# Patient Record
Sex: Female | Born: 1965
Health system: Southern US, Community
[De-identification: ages and names within clinical notes are randomized; demographics above are authoritative.]

## PROBLEM LIST (undated history)

## (undated) ENCOUNTER — Inpatient Hospital Stay (HOSPITAL_COMMUNITY): Payer: BLUE CROSS/BLUE SHIELD

## (undated) DIAGNOSIS — M84375A Stress fracture, left foot, initial encounter for fracture: Secondary | ICD-10-CM

## (undated) DIAGNOSIS — I4719 Other supraventricular tachycardia: Secondary | ICD-10-CM

## (undated) DIAGNOSIS — IMO0001 Reserved for inherently not codable concepts without codable children: Secondary | ICD-10-CM

## (undated) DIAGNOSIS — J069 Acute upper respiratory infection, unspecified: Secondary | ICD-10-CM

## (undated) DIAGNOSIS — D802 Selective deficiency of immunoglobulin A [IgA]: Secondary | ICD-10-CM

## (undated) DIAGNOSIS — G43909 Migraine, unspecified, not intractable, without status migrainosus: Secondary | ICD-10-CM

## (undated) DIAGNOSIS — M3500 Sicca syndrome, unspecified: Secondary | ICD-10-CM

## (undated) DIAGNOSIS — I471 Supraventricular tachycardia: Secondary | ICD-10-CM

## (undated) DIAGNOSIS — M069 Rheumatoid arthritis, unspecified: Secondary | ICD-10-CM

## (undated) DIAGNOSIS — M797 Fibromyalgia: Secondary | ICD-10-CM

## (undated) DIAGNOSIS — K219 Gastro-esophageal reflux disease without esophagitis: Secondary | ICD-10-CM

## (undated) DIAGNOSIS — U071 COVID-19: Secondary | ICD-10-CM

## (undated) HISTORY — PX: ROTATOR CUFF REPAIR: SHX139

## (undated) HISTORY — DX: Acute upper respiratory infection, unspecified: J06.9

## (undated) HISTORY — PX: SINOSCOPY: SHX187

## (undated) HISTORY — DX: Rheumatoid arthritis, unspecified: M06.9

## (undated) HISTORY — PX: TONSILLECTOMY: SUR1361

## (undated) HISTORY — PX: BREAST ENHANCEMENT SURGERY: SHX7

## (undated) HISTORY — PX: KNEE ARTHROSCOPY: SUR90

## (undated) HISTORY — DX: COVID-19: U07.1

## (undated) HISTORY — DX: Supraventricular tachycardia: I47.1

## (undated) HISTORY — PX: TOE SURGERY: SHX1073

## (undated) HISTORY — DX: Gastro-esophageal reflux disease without esophagitis: K21.9

## (undated) HISTORY — DX: Sjogren syndrome, unspecified: M35.00

## (undated) HISTORY — DX: Reserved for inherently not codable concepts without codable children: IMO0001

## (undated) HISTORY — DX: Stress fracture, left foot, initial encounter for fracture: M84.375A

## (undated) HISTORY — PX: HIP ARTHROSCOPY: SUR88

## (undated) HISTORY — DX: Migraine, unspecified, not intractable, without status migrainosus: G43.909

## (undated) HISTORY — DX: Other supraventricular tachycardia: I47.19

## (undated) HISTORY — PX: NASAL SINUS SURGERY: SHX719

## (undated) HISTORY — DX: Selective deficiency of immunoglobulin a (iga): D80.2

## (undated) HISTORY — PX: LASIK: SHX215

## (undated) HISTORY — PX: POLYPECTOMY: SHX149

---

## 1998-06-05 ENCOUNTER — Other Ambulatory Visit: Admission: RE | Admit: 1998-06-05 | Discharge: 1998-06-05 | Payer: Self-pay | Admitting: *Deleted

## 1999-04-24 ENCOUNTER — Inpatient Hospital Stay (HOSPITAL_COMMUNITY): Admission: AD | Admit: 1999-04-24 | Discharge: 1999-04-27 | Payer: Self-pay | Admitting: Obstetrics and Gynecology

## 1999-04-24 ENCOUNTER — Encounter (INDEPENDENT_AMBULATORY_CARE_PROVIDER_SITE_OTHER): Payer: Self-pay | Admitting: Specialist

## 1999-04-28 ENCOUNTER — Encounter: Admission: RE | Admit: 1999-04-28 | Discharge: 1999-07-27 | Payer: Self-pay | Admitting: Obstetrics and Gynecology

## 1999-05-21 ENCOUNTER — Other Ambulatory Visit: Admission: RE | Admit: 1999-05-21 | Discharge: 1999-05-21 | Payer: Self-pay | Admitting: Obstetrics and Gynecology

## 1999-08-04 ENCOUNTER — Emergency Department (HOSPITAL_COMMUNITY): Admission: EM | Admit: 1999-08-04 | Discharge: 1999-08-04 | Payer: Self-pay | Admitting: Internal Medicine

## 2000-07-10 ENCOUNTER — Other Ambulatory Visit: Admission: RE | Admit: 2000-07-10 | Discharge: 2000-07-10 | Payer: Self-pay | Admitting: Obstetrics and Gynecology

## 2001-08-08 ENCOUNTER — Other Ambulatory Visit: Admission: RE | Admit: 2001-08-08 | Discharge: 2001-08-08 | Payer: Self-pay | Admitting: Obstetrics and Gynecology

## 2002-08-16 ENCOUNTER — Encounter: Payer: Self-pay | Admitting: Gastroenterology

## 2002-10-15 ENCOUNTER — Other Ambulatory Visit: Admission: RE | Admit: 2002-10-15 | Discharge: 2002-10-15 | Payer: Self-pay | Admitting: Obstetrics and Gynecology

## 2002-12-05 ENCOUNTER — Ambulatory Visit (HOSPITAL_COMMUNITY): Admission: RE | Admit: 2002-12-05 | Discharge: 2002-12-05 | Payer: Self-pay | Admitting: Obstetrics and Gynecology

## 2003-02-27 ENCOUNTER — Ambulatory Visit (HOSPITAL_COMMUNITY): Admission: RE | Admit: 2003-02-27 | Discharge: 2003-02-27 | Payer: Self-pay | Admitting: Obstetrics and Gynecology

## 2003-07-07 ENCOUNTER — Encounter: Payer: Self-pay | Admitting: Internal Medicine

## 2003-08-31 ENCOUNTER — Ambulatory Visit (HOSPITAL_COMMUNITY): Admission: RE | Admit: 2003-08-31 | Discharge: 2003-08-31 | Payer: Self-pay | Admitting: Family Medicine

## 2003-10-09 ENCOUNTER — Ambulatory Visit (HOSPITAL_COMMUNITY): Admission: RE | Admit: 2003-10-09 | Discharge: 2003-10-09 | Payer: Self-pay | Admitting: Family Medicine

## 2003-12-05 ENCOUNTER — Encounter: Admission: RE | Admit: 2003-12-05 | Discharge: 2003-12-05 | Payer: Self-pay | Admitting: Family Medicine

## 2004-01-20 ENCOUNTER — Ambulatory Visit (HOSPITAL_COMMUNITY): Admission: RE | Admit: 2004-01-20 | Discharge: 2004-01-20 | Payer: Self-pay | Admitting: Orthopedic Surgery

## 2004-06-01 ENCOUNTER — Ambulatory Visit (HOSPITAL_COMMUNITY): Admission: RE | Admit: 2004-06-01 | Discharge: 2004-06-01 | Payer: Self-pay | Admitting: Orthopedic Surgery

## 2004-06-25 ENCOUNTER — Ambulatory Visit: Payer: Self-pay | Admitting: Internal Medicine

## 2004-07-15 ENCOUNTER — Ambulatory Visit: Payer: Self-pay | Admitting: Internal Medicine

## 2004-07-28 ENCOUNTER — Ambulatory Visit: Payer: Self-pay | Admitting: Internal Medicine

## 2004-08-23 ENCOUNTER — Ambulatory Visit: Payer: Self-pay | Admitting: Gastroenterology

## 2004-08-24 ENCOUNTER — Ambulatory Visit: Payer: Self-pay | Admitting: Gastroenterology

## 2004-08-31 ENCOUNTER — Ambulatory Visit: Payer: Self-pay | Admitting: Gastroenterology

## 2004-09-16 ENCOUNTER — Ambulatory Visit: Payer: Self-pay | Admitting: Gastroenterology

## 2004-09-16 ENCOUNTER — Ambulatory Visit (HOSPITAL_COMMUNITY): Admission: RE | Admit: 2004-09-16 | Discharge: 2004-09-16 | Payer: Self-pay | Admitting: Gastroenterology

## 2004-10-19 ENCOUNTER — Ambulatory Visit: Payer: Self-pay | Admitting: Gastroenterology

## 2004-11-05 ENCOUNTER — Ambulatory Visit: Payer: Self-pay | Admitting: Gastroenterology

## 2005-02-03 ENCOUNTER — Ambulatory Visit: Payer: Self-pay | Admitting: Gastroenterology

## 2005-03-02 ENCOUNTER — Ambulatory Visit: Payer: Self-pay | Admitting: Gastroenterology

## 2005-03-02 ENCOUNTER — Encounter: Payer: Self-pay | Admitting: Gastroenterology

## 2005-03-02 ENCOUNTER — Ambulatory Visit (HOSPITAL_COMMUNITY): Admission: RE | Admit: 2005-03-02 | Discharge: 2005-03-02 | Payer: Self-pay | Admitting: Gastroenterology

## 2005-03-02 ENCOUNTER — Encounter (INDEPENDENT_AMBULATORY_CARE_PROVIDER_SITE_OTHER): Payer: Self-pay | Admitting: Specialist

## 2005-03-10 ENCOUNTER — Encounter: Admission: RE | Admit: 2005-03-10 | Discharge: 2005-03-10 | Payer: Self-pay | Admitting: Family Medicine

## 2005-03-16 ENCOUNTER — Ambulatory Visit: Payer: Self-pay | Admitting: Gastroenterology

## 2005-04-26 ENCOUNTER — Ambulatory Visit: Payer: Self-pay | Admitting: Gastroenterology

## 2005-05-26 ENCOUNTER — Ambulatory Visit: Payer: Self-pay | Admitting: Family Medicine

## 2005-06-03 ENCOUNTER — Ambulatory Visit: Payer: Self-pay | Admitting: Internal Medicine

## 2005-06-06 ENCOUNTER — Ambulatory Visit: Payer: Self-pay | Admitting: Internal Medicine

## 2005-06-10 ENCOUNTER — Ambulatory Visit: Payer: Self-pay | Admitting: Internal Medicine

## 2005-06-20 ENCOUNTER — Ambulatory Visit: Payer: Self-pay | Admitting: Internal Medicine

## 2005-07-08 ENCOUNTER — Ambulatory Visit: Payer: Self-pay | Admitting: Internal Medicine

## 2005-08-15 ENCOUNTER — Ambulatory Visit: Payer: Self-pay | Admitting: Gastroenterology

## 2006-01-13 ENCOUNTER — Ambulatory Visit: Payer: Self-pay | Admitting: Gastroenterology

## 2006-04-14 ENCOUNTER — Ambulatory Visit: Payer: Self-pay | Admitting: Internal Medicine

## 2006-04-14 LAB — CONVERTED CEMR LAB
ALT: 14 units/L (ref 0–40)
AST: 18 units/L (ref 0–37)
Albumin: 4.1 g/dL (ref 3.5–5.2)
Alkaline Phosphatase: 36 units/L — ABNORMAL LOW (ref 39–117)
BUN: 10 mg/dL (ref 6–23)
Basophils Absolute: 0 10*3/uL (ref 0.0–0.1)
Basophils Relative: 0.3 % (ref 0.0–1.0)
CO2: 28 meq/L (ref 19–32)
Calcium: 9.4 mg/dL (ref 8.4–10.5)
Chloride: 108 meq/L (ref 96–112)
Creatinine, Ser: 1.1 mg/dL (ref 0.4–1.2)
Eosinophil percent: 1 % (ref 0.0–5.0)
GFR calc non Af Amer: 58 mL/min
Glomerular Filtration Rate, Af Am: 70 mL/min/{1.73_m2}
Glucose, Bld: 83 mg/dL (ref 70–99)
HCT: 40.7 % (ref 36.0–46.0)
Hemoglobin: 14 g/dL (ref 12.0–15.0)
Lymphocytes Relative: 32.3 % (ref 12.0–46.0)
MCHC: 34.4 g/dL (ref 30.0–36.0)
MCV: 90 fL (ref 78.0–100.0)
Monocytes Absolute: 0.5 10*3/uL (ref 0.2–0.7)
Monocytes Relative: 8.5 % (ref 3.0–11.0)
Neutro Abs: 3.5 10*3/uL (ref 1.4–7.7)
Neutrophils Relative %: 57.9 % (ref 43.0–77.0)
Platelets: 227 10*3/uL (ref 150–400)
Potassium: 4.3 meq/L (ref 3.5–5.1)
RBC: 4.52 M/uL (ref 3.87–5.11)
RDW: 11.9 % (ref 11.5–14.6)
Sodium: 141 meq/L (ref 135–145)
TSH: 1.32 microintl units/mL (ref 0.35–5.50)
Total Bilirubin: 0.7 mg/dL (ref 0.3–1.2)
Total Protein: 7.3 g/dL (ref 6.0–8.3)
WBC: 6.1 10*3/uL (ref 4.5–10.5)

## 2006-05-01 ENCOUNTER — Ambulatory Visit: Payer: Self-pay | Admitting: Family Medicine

## 2006-05-05 ENCOUNTER — Ambulatory Visit: Payer: Self-pay | Admitting: Internal Medicine

## 2006-05-05 LAB — CONVERTED CEMR LAB
Cholesterol: 205 mg/dL (ref 0–200)
Direct LDL: 134.3 mg/dL
HDL: 52.6 mg/dL (ref >39.0)
Total CHOL/HDL Ratio: 3.9
Triglycerides: 124 mg/dL (ref 0–149)
VLDL: 25 mg/dL (ref 0–40)

## 2006-05-11 DIAGNOSIS — J309 Allergic rhinitis, unspecified: Secondary | ICD-10-CM | POA: Insufficient documentation

## 2006-05-11 DIAGNOSIS — J45909 Unspecified asthma, uncomplicated: Secondary | ICD-10-CM | POA: Insufficient documentation

## 2006-05-24 ENCOUNTER — Emergency Department (HOSPITAL_COMMUNITY): Admission: EM | Admit: 2006-05-24 | Discharge: 2006-05-24 | Payer: Self-pay | Admitting: Emergency Medicine

## 2006-05-26 ENCOUNTER — Ambulatory Visit: Payer: Self-pay | Admitting: Internal Medicine

## 2006-07-07 ENCOUNTER — Ambulatory Visit: Payer: Self-pay | Admitting: Internal Medicine

## 2006-11-09 ENCOUNTER — Ambulatory Visit: Payer: Self-pay | Admitting: Gastroenterology

## 2006-11-20 ENCOUNTER — Ambulatory Visit: Payer: Self-pay | Admitting: Internal Medicine

## 2006-11-28 ENCOUNTER — Encounter (INDEPENDENT_AMBULATORY_CARE_PROVIDER_SITE_OTHER): Payer: Self-pay | Admitting: *Deleted

## 2006-11-28 LAB — CONVERTED CEMR LAB
Cholesterol: 191 mg/dL (ref 0–200)
HDL: 62.3 mg/dL (ref >39.0)
LDL Cholesterol: 114 mg/dL — ABNORMAL HIGH (ref 0–99)
Total CHOL/HDL Ratio: 3.1
Triglycerides: 73 mg/dL (ref 0–149)
VLDL: 15 mg/dL (ref 0–40)

## 2007-02-07 ENCOUNTER — Ambulatory Visit: Payer: Self-pay | Admitting: Internal Medicine

## 2007-04-02 ENCOUNTER — Ambulatory Visit: Payer: Self-pay | Admitting: Internal Medicine

## 2007-04-10 ENCOUNTER — Ambulatory Visit: Payer: Self-pay | Admitting: Internal Medicine

## 2007-04-20 ENCOUNTER — Telehealth: Payer: Self-pay | Admitting: Internal Medicine

## 2007-04-23 ENCOUNTER — Ambulatory Visit: Payer: Self-pay | Admitting: Cardiology

## 2007-04-24 ENCOUNTER — Telehealth (INDEPENDENT_AMBULATORY_CARE_PROVIDER_SITE_OTHER): Payer: Self-pay | Admitting: *Deleted

## 2007-04-24 ENCOUNTER — Ambulatory Visit: Payer: Self-pay | Admitting: Internal Medicine

## 2007-04-25 ENCOUNTER — Telehealth (INDEPENDENT_AMBULATORY_CARE_PROVIDER_SITE_OTHER): Payer: Self-pay | Admitting: *Deleted

## 2007-04-25 DIAGNOSIS — D802 Selective deficiency of immunoglobulin A [IgA]: Secondary | ICD-10-CM | POA: Insufficient documentation

## 2007-05-01 ENCOUNTER — Encounter: Payer: Self-pay | Admitting: Internal Medicine

## 2007-05-08 ENCOUNTER — Encounter: Payer: Self-pay | Admitting: Internal Medicine

## 2007-05-31 ENCOUNTER — Encounter: Payer: Self-pay | Admitting: Internal Medicine

## 2007-07-24 DIAGNOSIS — K219 Gastro-esophageal reflux disease without esophagitis: Secondary | ICD-10-CM | POA: Insufficient documentation

## 2007-07-24 DIAGNOSIS — K589 Irritable bowel syndrome without diarrhea: Secondary | ICD-10-CM | POA: Insufficient documentation

## 2007-09-17 ENCOUNTER — Encounter: Payer: Self-pay | Admitting: Internal Medicine

## 2007-09-26 ENCOUNTER — Encounter: Payer: Self-pay | Admitting: Internal Medicine

## 2007-11-05 ENCOUNTER — Encounter: Payer: Self-pay | Admitting: Internal Medicine

## 2008-01-01 ENCOUNTER — Ambulatory Visit: Payer: Self-pay | Admitting: Internal Medicine

## 2008-01-18 ENCOUNTER — Telehealth: Payer: Self-pay | Admitting: Gastroenterology

## 2008-01-21 ENCOUNTER — Ambulatory Visit: Payer: Self-pay | Admitting: Gastroenterology

## 2008-01-24 ENCOUNTER — Encounter: Payer: Self-pay | Admitting: Gastroenterology

## 2008-01-28 ENCOUNTER — Telehealth: Payer: Self-pay | Admitting: Gastroenterology

## 2008-02-05 ENCOUNTER — Telehealth: Payer: Self-pay | Admitting: Gastroenterology

## 2008-04-23 ENCOUNTER — Encounter: Payer: Self-pay | Admitting: Internal Medicine

## 2008-04-28 ENCOUNTER — Encounter: Payer: Self-pay | Admitting: Internal Medicine

## 2008-04-29 ENCOUNTER — Ambulatory Visit: Payer: Self-pay | Admitting: Gastroenterology

## 2008-04-29 ENCOUNTER — Ambulatory Visit: Payer: Self-pay | Admitting: Internal Medicine

## 2008-04-29 DIAGNOSIS — R1319 Other dysphagia: Secondary | ICD-10-CM | POA: Insufficient documentation

## 2008-04-29 DIAGNOSIS — E785 Hyperlipidemia, unspecified: Secondary | ICD-10-CM | POA: Insufficient documentation

## 2008-04-29 LAB — CONVERTED CEMR LAB
Cholesterol, target level: 200 mg/dL
HDL goal, serum: 50 mg/dL
LDL Goal: 120 mg/dL

## 2008-05-01 ENCOUNTER — Ambulatory Visit: Payer: Self-pay | Admitting: Gastroenterology

## 2008-05-04 LAB — CONVERTED CEMR LAB
ALT: 25 units/L (ref 0–35)
AST: 24 units/L (ref 0–37)
Albumin: 3.9 g/dL (ref 3.5–5.2)
Alkaline Phosphatase: 52 units/L (ref 39–117)
BUN: 12 mg/dL (ref 6–23)
Basophils Absolute: 0 10*3/uL (ref 0.0–0.1)
Basophils Relative: 0.6 % (ref 0.0–3.0)
Bilirubin, Direct: 0.1 mg/dL (ref 0.0–0.3)
CO2: 30 meq/L (ref 19–32)
Calcium: 9.1 mg/dL (ref 8.4–10.5)
Chloride: 105 meq/L (ref 96–112)
Cholesterol: 177 mg/dL (ref 0–200)
Creatinine, Ser: 0.7 mg/dL (ref 0.4–1.2)
Eosinophils Absolute: 0.1 10*3/uL (ref 0.0–0.7)
Eosinophils Relative: 1.8 % (ref 0.0–5.0)
GFR calc Af Amer: 117 mL/min
GFR calc non Af Amer: 97 mL/min
Glucose, Bld: 88 mg/dL (ref 70–99)
HCT: 40.6 % (ref 36.0–46.0)
HDL: 54 mg/dL (ref >39.0)
Hemoglobin: 13.8 g/dL (ref 12.0–15.0)
LDL Cholesterol: 112 mg/dL — ABNORMAL HIGH (ref 0–99)
Lymphocytes Relative: 43.6 % (ref 12.0–46.0)
MCHC: 34 g/dL (ref 30.0–36.0)
MCV: 93.4 fL (ref 78.0–100.0)
Monocytes Absolute: 0.5 10*3/uL (ref 0.1–1.0)
Monocytes Relative: 10.6 % (ref 3.0–12.0)
Neutro Abs: 2.2 10*3/uL (ref 1.4–7.7)
Neutrophils Relative %: 43.4 % (ref 43.0–77.0)
Platelets: 190 10*3/uL (ref 150–400)
Potassium: 3.9 meq/L (ref 3.5–5.1)
RBC: 4.35 M/uL (ref 3.87–5.11)
RDW: 12.3 % (ref 11.5–14.6)
Sodium: 140 meq/L (ref 135–145)
Total Bilirubin: 0.7 mg/dL (ref 0.3–1.2)
Total CHOL/HDL Ratio: 3.3
Total Protein: 7 g/dL (ref 6.0–8.3)
Triglycerides: 55 mg/dL (ref 0–149)
VLDL: 11 mg/dL (ref 0–40)
WBC: 5 10*3/uL (ref 4.5–10.5)

## 2008-05-05 ENCOUNTER — Encounter (INDEPENDENT_AMBULATORY_CARE_PROVIDER_SITE_OTHER): Payer: Self-pay | Admitting: *Deleted

## 2008-05-07 ENCOUNTER — Ambulatory Visit: Payer: Self-pay | Admitting: Gastroenterology

## 2008-06-20 ENCOUNTER — Ambulatory Visit (HOSPITAL_COMMUNITY): Admission: RE | Admit: 2008-06-20 | Discharge: 2008-06-20 | Payer: Self-pay | Admitting: Obstetrics and Gynecology

## 2008-06-20 ENCOUNTER — Encounter (INDEPENDENT_AMBULATORY_CARE_PROVIDER_SITE_OTHER): Payer: Self-pay | Admitting: Obstetrics and Gynecology

## 2008-07-04 ENCOUNTER — Telehealth (INDEPENDENT_AMBULATORY_CARE_PROVIDER_SITE_OTHER): Payer: Self-pay | Admitting: *Deleted

## 2008-07-15 ENCOUNTER — Encounter: Payer: Self-pay | Admitting: Internal Medicine

## 2008-07-29 ENCOUNTER — Telehealth: Payer: Self-pay | Admitting: Gastroenterology

## 2008-08-22 ENCOUNTER — Ambulatory Visit: Payer: Self-pay | Admitting: Internal Medicine

## 2008-09-05 ENCOUNTER — Telehealth (INDEPENDENT_AMBULATORY_CARE_PROVIDER_SITE_OTHER): Payer: Self-pay | Admitting: *Deleted

## 2008-12-19 ENCOUNTER — Encounter: Payer: Self-pay | Admitting: Internal Medicine

## 2009-01-08 ENCOUNTER — Encounter: Admission: RE | Admit: 2009-01-08 | Discharge: 2009-01-08 | Payer: Self-pay | Admitting: Orthopedic Surgery

## 2009-06-18 ENCOUNTER — Ambulatory Visit: Payer: Self-pay | Admitting: Internal Medicine

## 2009-06-18 DIAGNOSIS — M545 Low back pain, unspecified: Secondary | ICD-10-CM | POA: Insufficient documentation

## 2009-06-18 DIAGNOSIS — Z87898 Personal history of other specified conditions: Secondary | ICD-10-CM | POA: Insufficient documentation

## 2009-06-25 LAB — CONVERTED CEMR LAB
ALT: 17 units/L (ref 0–35)
AST: 20 units/L (ref 0–37)
Albumin: 4.5 g/dL (ref 3.5–5.2)
Alkaline Phosphatase: 40 units/L (ref 39–117)
BUN: 18 mg/dL (ref 6–23)
Basophils Absolute: 0 10*3/uL (ref 0.0–0.1)
Basophils Relative: 0.1 % (ref 0.0–3.0)
Bilirubin, Direct: 0 mg/dL (ref 0.0–0.3)
CO2: 31 meq/L (ref 19–32)
Calcium: 9 mg/dL (ref 8.4–10.5)
Chloride: 106 meq/L (ref 96–112)
Cholesterol: 201 mg/dL — ABNORMAL HIGH (ref 0–200)
Creatinine, Ser: 0.8 mg/dL (ref 0.4–1.2)
Direct LDL: 131.2 mg/dL
Eosinophils Absolute: 0.1 10*3/uL (ref 0.0–0.7)
Eosinophils Relative: 1.2 % (ref 0.0–5.0)
GFR calc non Af Amer: 82.73 mL/min (ref >60)
Glucose, Bld: 78 mg/dL (ref 70–99)
HCT: 42.3 % (ref 36.0–46.0)
HDL: 61.9 mg/dL (ref >39.00)
Hemoglobin: 13.9 g/dL (ref 12.0–15.0)
Lymphocytes Relative: 32.4 % (ref 12.0–46.0)
Lymphs Abs: 1.9 10*3/uL (ref 0.7–4.0)
MCHC: 32.8 g/dL (ref 30.0–36.0)
MCV: 91.6 fL (ref 78.0–100.0)
Monocytes Absolute: 0.5 10*3/uL (ref 0.1–1.0)
Monocytes Relative: 9.2 % (ref 3.0–12.0)
Neutro Abs: 3.4 10*3/uL (ref 1.4–7.7)
Neutrophils Relative %: 57.1 % (ref 43.0–77.0)
Platelets: 218 10*3/uL (ref 150.0–400.0)
Potassium: 4.1 meq/L (ref 3.5–5.1)
RBC: 4.62 M/uL (ref 3.87–5.11)
RDW: 12.3 % (ref 11.5–14.6)
Sodium: 140 meq/L (ref 135–145)
TSH: 1.44 microintl units/mL (ref 0.35–5.50)
Total Bilirubin: 0.6 mg/dL (ref 0.3–1.2)
Total CHOL/HDL Ratio: 3
Total Protein: 7.9 g/dL (ref 6.0–8.3)
Triglycerides: 49 mg/dL (ref 0.0–149.0)
VLDL: 9.8 mg/dL (ref 0.0–40.0)
WBC: 5.9 10*3/uL (ref 4.5–10.5)

## 2009-10-20 ENCOUNTER — Ambulatory Visit: Payer: Self-pay | Admitting: Internal Medicine

## 2009-10-20 DIAGNOSIS — J040 Acute laryngitis: Secondary | ICD-10-CM | POA: Insufficient documentation

## 2009-10-20 LAB — CONVERTED CEMR LAB: Rapid Strep: NEGATIVE

## 2009-12-29 ENCOUNTER — Ambulatory Visit: Payer: Self-pay | Admitting: Internal Medicine

## 2010-04-09 ENCOUNTER — Ambulatory Visit
Admission: RE | Admit: 2010-04-09 | Discharge: 2010-04-09 | Payer: Self-pay | Source: Home / Self Care | Attending: Internal Medicine | Admitting: Internal Medicine

## 2010-04-09 DIAGNOSIS — L608 Other nail disorders: Secondary | ICD-10-CM | POA: Insufficient documentation

## 2010-04-09 DIAGNOSIS — R6889 Other general symptoms and signs: Secondary | ICD-10-CM | POA: Insufficient documentation

## 2010-04-09 DIAGNOSIS — L659 Nonscarring hair loss, unspecified: Secondary | ICD-10-CM | POA: Insufficient documentation

## 2010-04-19 ENCOUNTER — Telehealth: Payer: Self-pay | Admitting: Internal Medicine

## 2010-04-19 LAB — CONVERTED CEMR LAB
Free T4: 0.84 ng/dL (ref 0.60–1.60)
T3, Free: 2.5 pg/mL (ref 2.3–4.2)
TSH: 1.16 microintl units/mL (ref 0.35–5.50)

## 2010-04-29 ENCOUNTER — Encounter: Payer: Self-pay | Admitting: Internal Medicine

## 2010-05-11 NOTE — Progress Notes (Signed)
Summary: COPY OF LAB WORK FROM ANOTHER OFFICE  Phone Note Call from Patient Call back at Home Phone [phone]   Caller: Patient Summary of Call: PATIENT DROPPED OFF TWO PIECES OF PAPER FOR DR HOPPER TO REVIEW--WILL MAKE COPY, WILL SEND ORIGINALS TO [facility] ([facility] WITH HOPP TODAY)  FOR DR HOPPER TO REVIEW AND PLACE IN PATIENT'S CHART  PATIENT ALSO NOTED THAT [facility] OFFICE = GASTROENTEROLOGIST----PERFORMED HER LATEST BLOOD WORK-- THEREFORE SHE DIDNT NEED TO GET BLOOD WORK AT HER LAST VISIT WITH DR HOPPER-----CAN DR HOPPER GO OVER THESE RESULTS??----SHE SAYS SHE "LIKES IT WHEN DR HOPPER WRITES ALL OVER HER LAB WORK AND SENDS HER THE RESULTS"---CAN HE DO THAT FOR THE LAB WORK FROM DR [facility] OFFICE?? Initial call taken by: Jerolyn Shin,  July 04, 2008 12:03 PM  Follow-up for Phone Call        pls see on ledge..............Marland KitchenDoristine Devoid  July 04, 2008 4:47 PM   Additional Follow-up for Phone Call Additional follow up Details #1::        please send her copy of labs & Appendum I typed 05/01/2008 about labs. Thanks,Hopp Additional Follow-up by: Marga Melnick MD,  July 08, 2008 6:36 AM    Additional Follow-up for Phone Call Additional follow up Details #2::    DONE Follow-up by: Shonna Chock,  July 15, 2008 1:39 PM

## 2010-05-11 NOTE — Procedures (Signed)
Summary: Gastroenterology endo  Gastroenterology endo   Imported By: Donneta Romberg 07/24/2007 10:10:32  _____________________________________________________________________  External Attachment:    Type:   Image     Comment:   External Document

## 2010-05-11 NOTE — Assessment & Plan Note (Signed)
Summary: poison ivy,throat sore from choaking/cbs   Vital Signs:  Patient profile:   45 year old female Weight:      161 pounds Temp:     98.1 degrees F oral Pulse rate:   72 / minute Resp:     15 per minute BP sitting:   110 / 70  (left arm) Cuff size:   large  Vitals Entered By: Shonna Chock (Aug 22, 2008 10:11 AM) CC: POISON IVY AND PATIENT CHOAKED YESTERDAY-NOW THROAT IS SORE   Primary Care Provider:  Marga Melnick, MD  CC:  POISON IVY AND PATIENT CHOAKED YESTERDAY-NOW THROAT IS SORE.  History of Present Illness: Onset 08/19/08 as single papule R antecubital area ; no plant exposure. 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 Rash has spread to thorax ant & posteriorly, thighs & popliteal area. Itching , "on fire " character to rash. Only new meds = Darvocet & vitamin C. PMH of itching with vit C & rose hips.No hyperallergenic food intake pre rash except nuts & tomatoes. Epsom salts bath only while in bath. Benadryl & anti itch cream  helped. Her son has less severe similar rash. She has IgA deficiency.               On 5/13 she choked on fried chicken ; residual soreness.  Allergies: 1)  ! * All Pain Meds Except Darvocet 2)  ! * Fluoquinolones 3)  ! Augmentin 4)  ! Zithromax 5)  ! * Singulair 6)  ! Cephalexin 7)  ! * Robinul Forte 8)  * Cephosporns 9)  * Sulfa (Sulfonamides) Group 10)  * Codeine  Review of Systems General:  Denies chills, fever, and sweats. Resp:  Denies shortness of breath and wheezing. Derm:  Complains of itching and rash. Allergy:  Denies itching eyes and sneezing; No angioedema.  Physical Exam  General:  well-nourished,in no acute distress; alert,appropriate and cooperative throughout examination Mouth:  Oral mucosa and oropharynx without lesions or exudates.  Teeth in good repair. No angioedema or pharyngeal erythema.   Lungs:  Normal respiratory effort, chest expands symmetrically. Lungs are clear to auscultation, no crackles or  wheezes. Skin:  Scattered vascular papules which blanch will pressure; Dermatographia Cervical Nodes:  No lymphadenopathy noted Axillary Nodes:  No palpable lymphadenopathy   Impression & Recommendations:  Problem # 1:  ALLERGIC URTICARIA (ICD-708.0)  Problem # 2:  ESOPHAGITIS (ICD-530.10) due to choking  Her updated medication list for this problem includes:    Zegerid 40-1100 Mg Caps (Omeprazole-sodium bicarbonate) .Marland Kitchen... Take 1 tablet by mouth once daily to two times a day    Hyomax-dt 0.375 Mg Cr-tabs (Hyoscyamine sulfate) ..... One tablet by mouth once daily as needed    Ranitidine Hcl 150 Mg Tabs (Ranitidine hcl) .Marland Kitchen... 1 two times a day pre meals  Complete Medication List: 1)  Valtrex 1 Gm Tabs (Valacyclovir hcl) .... Take 1/2 tablet by mouth every day 2)  Nasonex 50 Mcg/act Susp (Mometasone furoate) .Marland Kitchen.. 1 spray each nostril once daily as needed 3)  Zegerid 40-1100 Mg Caps (Omeprazole-sodium bicarbonate) .... Take 1 tablet by mouth once daily to two times a day 4)  Ibuprofen  .... Prn 5)  Xopenex Hfa 45 Mcg/act Aero (Levalbuterol tartrate) .... Use as directed 6)  Atenolol 25 Mg Tabs (Atenolol) .... Take 1/2 tablet by mouth once daily for headaches 7)  Clonazepam 0.5 Mg Tabs (Clonazepam) .... Take 1/2 tablet by mouth at bedtime as needed 8)  Multivitamins Tabs (Multiple  vitamin) .... Take 1 tablet by mouth once a day 9)  Calcium & Vitamin D 400  .... Take 1 tablet by mouth once a day 10)  Sm Flax Seed Oil 1000 Mg Caps (Flaxseed (linseed)) .... Take 1 tablet by mouth once a day 11)  Hyomax-dt 0.375 Mg Cr-tabs (Hyoscyamine sulfate) .... One tablet by mouth once daily as needed 12)  Darvocet(? Dose)  .... Off/on for leg pain 13)  Ranitidine Hcl 150 Mg Tabs (Ranitidine hcl) .Marland Kitchen.. 1 two times a day pre meals 14)  Hydroxyzine Pamoate 25 Mg Caps (Hydroxyzine pamoate) .Marland Kitchen.. 1-2 q 6 hrs as needed itching 15)  Prednisone 20 Mg Tabs (Prednisone) .Marland Kitchen.. 1 two times a day with meals  Patient  Instructions: 1)  Keep a TRIGGER Diary .Hold vit C ; avoid hyperallergenic foods as discussed(nuts, shellfish,choc,tomatoes, strawberries) Prescriptions: PREDNISONE 20 MG TABS (PREDNISONE) 1 two times a day with meals  #14 x 0   Entered and Authorized by:   Marga Melnick MD   Signed by:   Marga Melnick MD on 08/22/2008   Method used:   Print then Give to Patient   RxID:   [id number] HYDROXYZINE PAMOATE 25 MG CAPS (HYDROXYZINE PAMOATE) 1-2 q 6 hrs as needed itching  #30 x 1   Entered and Authorized by:   Marga Melnick MD   Signed by:   Marga Melnick MD on 08/22/2008   Method used:   Print then Give to Patient   RxID:   [id number] RANITIDINE HCL 150 MG TABS (RANITIDINE HCL) 1 two times a day pre meals  #60 x 0   Entered and Authorized by:   Marga Melnick MD   Signed by:   Marga Melnick MD on 08/22/2008   Method used:   Print then Give to Patient   RxID:   [id number]

## 2010-05-11 NOTE — Consult Note (Signed)
Summary: [facility]  [facility]   Imported By: Lester  12/25/2008 10:12:55  _____________________________________________________________________  External Attachment:    Type:   Image     Comment:   External Document

## 2010-05-11 NOTE — Medication Information (Signed)
Summary: Approval for Rx [employer]  Approval for Rx X[facility]/[employer]   Imported By: Esmeralda Links D'jimraou 02/01/2008 16:09:30  _____________________________________________________________________  External Attachment:    Type:   Image     Comment:   External Document

## 2010-05-11 NOTE — Consult Note (Signed)
Summary: [facility]  [facility]   Imported By: Lanelle Bal 07/22/2008 10:02:24  _____________________________________________________________________  External Attachment:    Type:   Image     Comment:   External Document

## 2010-05-11 NOTE — Letter (Signed)
Summary: Results Follow up Letter  [facility] [place]  [address] [place], [place] [address]   Phone: [phone]  Fax: [phone]    11/28/2006 MRN: [id number]  [facility] [address] [place], [place]  [address]  Dear [name],  The following are the results of your recent test(s):  Test         Result    Pap Smear:        Normal _____  Not Normal _____ Comments: ______________________________________________________ Cholesterol: LDL(Bad cholesterol):         Your goal is less than:         HDL (Good cholesterol):       Your goal is more than: Comments:  ______________________________________________________ Mammogram:        Normal _____  Not Normal _____ Comments:  ___________________________________________________________________ Hemoccult:        Normal _____  Not normal _______ Comments:    _____________________________________________________________________ Other Tests:  Please see attached results and comments   We routinely do not discuss normal results over the telephone.  If you desire a copy of the results, or you have any questions about this information we can discuss them at your next office visit.   Sincerely,

## 2010-05-11 NOTE — Letter (Signed)
Summary: Cancer Screening/[facility] Personalized Risk Profile  Cancer Screening/[facility] Personalized Risk Profile   Imported By: Lanelle Bal 06/24/2009 08:11:38  _____________________________________________________________________  External Attachment:    Type:   Image     Comment:   External Document
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
## 2010-05-11 NOTE — Progress Notes (Signed)
Summary: Doxycycline   Phone Note Call from Patient Call back at Home Phone [phone]   Caller: Patient Call For: Dr. Russella Dar Reason for Call: Talk to Nurse Details for Reason: Doxycycline Summary of Call: has questions regarding new rx, Doxycycline Initial call taken by: Vallarie Mare,  July 29, 2008 10:15 AM  Follow-up for Phone Call        Spoke with pt. She states Dr Naoma Diener put her on Doxycycline  two times a day for 2 weeks for a staph infection on her scalp. In the past, she has gotten c-diff due to taking an antibiotic and wants to know if she needs to take Flagyl as preventive. If necessary, Dr Naoma Diener will prescribe Flagyl. ,they want your advice. Follow-up by: Ulis Rias RN,  July 29, 2008 11:44 AM  Additional Follow-up for Phone Call Additional follow up Details #1::        Suggest flagyl for duration of doxycycline and Align for at one month. Additional Follow-up by: Meryl Dare MD Clementeen Graham,  July 29, 2008 11:51 AM    Additional Follow-up for Phone Call Additional follow up Details #2::    Spoke with patient and advised  per Dr Russella Dar, to take Flagyl for the duration of the doxyclycline and to take Align for one month. Pt states she understood and will call back if she has further questions.Ulis Rias RN  July 29, 2008 12:21 PM

## 2010-05-11 NOTE — Letter (Signed)
Summary: Letter to Patient Concerning Labs & Cervical Cultures/[facility]  Letter to Patient Concerning Labs & Cervical Cultures/[facility]   Imported By: Lanelle Bal 07/10/2008 09:41:23  _____________________________________________________________________  External Attachment:    Type:   Image     Comment:   External Document

## 2010-05-11 NOTE — Letter (Signed)
Summary: External Correspondence-[facility]  External Correspondence-[facility]   Imported By: Vanessa Swaziland 02/26/2007 15:39:03  _____________________________________________________________________  External Attachment:    Type:   Image     Comment:   External Document

## 2010-05-11 NOTE — Assessment & Plan Note (Signed)
Summary: flu shot.cbs   Nurse Visit   Allergies: 1)  ! * All Pain Meds Except Darvocet 2)  ! * Fluoquinolones 3)  ! Augmentin 4)  ! Zithromax 5)  ! * Singulair 6)  ! Cephalexin 7)  ! * Robinul Forte 8)  * Cephosporns 9)  * Sulfa (Sulfonamides) Group 10)  * Codeine  Orders Added: 1)  Admin 1st Vaccine [90471] 2)  Flu Vaccine 46yrs + [40102] Flu Vaccine Consent Questions     Do you have a history of severe allergic reactions to this vaccine? no    Any prior history of allergic reactions to egg and/or gelatin? no    Do you have a sensitivity to the preservative Thimersol? no    Do you have a past history of Guillan-Barre Syndrome? no    Do you currently have an acute febrile illness? no    Have you ever had a severe reaction to latex? no    Vaccine information given and explained to patient? yes    Are you currently pregnant? no    Lot Number:[id number]   Exp Date:10/09/2010   Site Given  Left Deltoid IMmin 1st Vaccine [90471] 2)  Flu Vaccine 4yrs + [72536] .lbflu

## 2010-05-11 NOTE — Assessment & Plan Note (Signed)
Summary: sore throat//kn   Vital Signs:  Patient profile:   45 year old female Weight:      176 pounds Temp:     98.2 degrees F oral Pulse rate:   76 / minute Resp:     14 per minute BP sitting:   116 / 70  (left arm) Cuff size:   large  Vitals Entered By: Shonna Chock CMA (October 20, 2009 12:04 PM) CC: Sore Throat, URI symptoms Comments REVIEWED MED LIST, PATIENT AGREED DOSE AND INSTRUCTION CORRECT    Primary Care Provider:  Marga Melnick, MD  CC:  Sore Throat and URI symptoms.  History of Present Illness: Onset PNDrainage 10/16/2009 followed by laryngitis & ST 07/11.Rx: tea & honey, 81 mg ASA 5/ day, 3 grams vit C / day. The patient reports clear nasal discharge and productive cough with clear sputum, but denies nasal congestion and significant earache.  Associated symptoms include diarrhea as of this am, use of an antipyretic with  response to antipyretic.  The patient denies fever, dyspnea, wheezing, and vomiting.  The patient also reports minor  headache.  The patient denies muscle aches and severe fatigue.  The patient denies the following risk factors for Strep sinusitis: unilateral facial pain, tooth pain, and tender adenopathy.    Allergies: 1)  ! * All Pain Meds Except Darvocet 2)  ! * Fluoquinolones 3)  ! Augmentin 4)  ! Zithromax 5)  ! * Singulair 6)  ! Cephalexin 7)  ! * Robinul Forte 8)  * Cephosporns 9)  * Sulfa (Sulfonamides) Group 10)  * Codeine  Physical Exam  General:  Appears tired,in no acute distress; alert,appropriate and cooperative throughout examination Ears:  External ear exam shows no significant lesions or deformities.  Otoscopic examination reveals clear canals, tympanic membranes are intact bilaterally without bulging, retraction, inflammation or discharge. Hearing is grossly normal bilaterally. Nose:  External nasal examination shows no deformity or inflammation. Nasal mucosa are pink and moist without lesions or exudates. Mouth:  Oral mucosa  and oropharynx without lesions or exudates.  Teeth in good repair. Lungs:  Normal respiratory effort, chest expands symmetrically. Lungs are clear to auscultation, no crackles or wheezes. Heart:  Normal rate and regular rhythm. S1 and S2 normal without gallop, murmur, click, rub .S4 Cervical Nodes:  No lymphadenopathy noted Axillary Nodes:  No palpable lymphadenopathy   Impression & Recommendations:  Problem # 1:  PHARYNGITIS-ACUTE (ICD-462)  Her updated medication list for this problem includes:    Aspirin 81 Mg Tabs (Aspirin) .Marland KitchenMarland KitchenMarland KitchenMarland Kitchen 3 by mouth once daily    Doxycycline Hyclate 100 Mg Caps (Doxycycline hyclate) .Marland Kitchen... 1 two times a day x 2 days then 1 once daily ; avoid direct sun  Problem # 2:  ACUTE LARYNGITIS, WITHOUT MENTION OF OBSTRUCTIO (ICD-464.00)  Problem # 3:  COUGH (ICD-786.2)  Problem # 4:  DIARRHEA (ICD-787.91) PMH of C. difficle colitis on Avelox  Complete Medication List: 1)  Valtrex 1 Gm Tabs (Valacyclovir hcl) .... Take 1/2 tablet by mouth every day as needed 2)  Nasonex 50 Mcg/act Susp (Mometasone furoate) .Marland Kitchen.. 1 spray each nostril once daily as needed 3)  Zegerid 40-1100 Mg Caps (Omeprazole-sodium bicarbonate) .... Take 1 tablet by mouth once daily to two times a day 4)  Xopenex Hfa 45 Mcg/act Aero (Levalbuterol tartrate) .... Use as directed 5)  Clonazepam 0.5 Mg Tabs (Clonazepam) .... Take 1/2 tablet by mouth at bedtime as needed 6)  Multivitamins Tabs (Multiple vitamin) .... Take 1 tablet by  mouth once a day 7)  Calcium & Vitamin D 400  .... Take 1 tablet by mouth once a day 8)  Sm Flax Seed Oil 1000 Mg Caps (Flaxseed (linseed)) .... Take 1 tablet by mouth once a day 9)  Aspirin 81 Mg Tabs (Aspirin) .... 3 by mouth once daily 10)  Vitamin C 1000 Mg Tabs (Ascorbic acid) .... 3 by mouth once daily 11)  Doxycycline Hyclate 100 Mg Caps (Doxycycline hyclate) .Marland Kitchen.. 1 two times a day x 2 days then 1 once daily ; avoid direct sun  Other Orders: Rapid Strep  (04540)  Patient Instructions: 1)  Zicam daily as needed for  ST.Drink as much fluid as you can tolerate for the next few days.Recommended remaining out of work for  10/20/2009. Prescriptions: DOXYCYCLINE HYCLATE 100 MG CAPS (DOXYCYCLINE HYCLATE) 1 two times a day X 2 days then 1 once daily ; avoid direct sun  #12 x 0   Entered and Authorized by:   Marga Melnick MD   Signed by:   Marga Melnick MD on 10/20/2009   Method used:   Faxed to ...       [facility] [phone]* (retail)       [address]       [place], [place]  [address]       Ph: [phone] or [phone]       Fax: [phone]   RxID:   [id number]   Laboratory Results    Other Tests  Rapid Strep: negative

## 2010-05-11 NOTE — Procedures (Signed)
Summary: endoscopy   ENDOSCOPY PROCEDURE REPORT  PATIENT:  Peggy Wallace, Peggy Wallace  MR#:  [id number] BIRTHDATE:   [birth year]   GENDER:   female  ENDOSCOPIST:   Venita Lick. Russella Dar, MD, [facility] Referred by:   PROCEDURE DATE:  05/07/2008 PROCEDURE:  EGD with dilatation over guidewire ASA CLASS:   Class II INDICATIONS: dysphagia, GERD   MEDICATIONS:   Fentanyl 25 mcg IV, Versed 5 mg IV, There was residual sedation effect present from prior procedure. TOPICAL ANESTHETIC:   Exactacain Spray  DESCRIPTION OF PROCEDURE:   After the risks benefits and alternatives of the procedure were thoroughly explained, informed consent was obtained.  The LB [id number]-H180 [id number] endoscope was introduced through the mouth and advanced to the second portion of the duodenum, without limitations.  The instrument was slowly withdrawn as the mucosa was fully examined. <<PROCEDUREIMAGES>>          <<OLD IMAGES>>  The upper, middle, and distal third of the esophagus were carefully inspected and no abnormalities were noted. The z-line was well seen at the GEJ. The endoscope was advanced into the fundus which was normal including a retroflexed view. The antrum, pylours, gastric body, first and second part of the duodenum were unremarkable.  Savary dilation over a guidewire was performed. 17 mm dilation without heme or resistance for dysphagia without stricture.  Retroflexed views revealed no abnormalities.  The scope was then withdrawn from the patient and the procedure completed.  COMPLICATIONS:   None  ENDOSCOPIC IMPRESSION:  1) Normal EGD 2) GERD RECOMMENDATIONS:  1) anti-reflux regimen  2) PPI bid  3) post dilation instructions  4) follow-up: GI clinic 1 year(s)   Maylynn Orzechowski T. Russella Dar, MD, [facility]    CC: Pecola Lawless MD

## 2010-05-11 NOTE — Assessment & Plan Note (Signed)
Summary: sore throat, cough a little bit of blood and blowing green//ca  Medications Added VALTREX 1 GM TABS (VALACYCLOVIR HCL) 1 by mouth qd CLONAZEPAM 0.5 MG TABS (CLONAZEPAM)  RANITIDINE HCL 300 MG CAPS (RANITIDINE HCL) Take 1 capsule by mouth once a day CLONAZEPAM 0.5 MG TABS (CLONAZEPAM) 1/2 at bedtime prn YAZ 3-0.02 MG TABS (DROSPIRENONE-ETHINYL ESTRADIOL)  NASONEX 50 MCG/ACT SUSP (MOMETASONE FUROATE) hold ALBUTEROL 90 MCG/ACT AERS (ALBUTEROL) Inhale 2 puff using inhaler every four hours ASMANEX 120 METERED DOSES 220 MCG/INH AEPB (MOMETASONE FUROATE) hold ZEGERID 40-1100 MG  CAPS (OMEPRAZOLE-SODIUM BICARBONATE) qd SYMBICORT 160-4.5 MCG/ACT  AERO (BUDESONIDE-FORMOTEROL FUMARATE) take as directed PROMETHAZINE-DM 6.25-15 MG/5ML  SYRP (PROMETHAZINE-DM) take as directed for cough AMOXICILLIN 875 MG  TABS (AMOXICILLIN) one by mouth bid * IBUPROFEN prn * MUCINEX D two times a day prn CLARITHROMYCIN 500 MG  TB24 (CLARITHROMYCIN) 2 once daily with food      Allergies Added: ! * ALL PAIN MEDS EXCEPT DARVOCET * CEPHOSPORNS * SULFA (SULFONAMIDES) GROUP * CODEINE  Vital Signs:  Patient Profile:   45 Years Old Female Weight:      170.50 pounds Temp:     97.4 degrees F oral Pulse rate:   76 / minute Pulse rhythm:   regular Resp:     14 per minute BP sitting:   100 / 58  (left arm) Cuff size:   large  Pt. in pain?   no  Vitals Entered By: Wendall Stade (April 02, 2007 10:42 AM)                  Chief Complaint:  coughing up bloody strings and URI symptoms.  History of Present Illness: She is coughing up green gobs with red strings of blood.  Diagnosed with bacterial infection of the sinus on saturday at urgent care  &  was started on amoxicillin on saturday. She  has had two episodes of coughing bloody strings, very wet cough, sore throat, elevated temp. WBC was "high"; Strep test negative.  URI Symptoms; Rx: Mucinex D, Promethazine DM cough syrup; Neti pot      This is  a 45 year old woman who presents with URI symptoms.  The patient reports nasal congestion, purulent nasal discharge, sore throat, and productive cough, but denies clear nasal discharge, dry cough, earache, and sick contacts.  The patient denies fever, stiff neck, dyspnea, wheezing, rash, vomiting, diarrhea, and use of an antipyretic.  The patient also reports severe fatigue.  The patient denies itchy watery eyes, itchy throat, sneezing, seasonal symptoms, response to antihistamine, headache, and muscle aches.  Risk factors for Strep sinusitis include tender adenopathy.  The patient denies the following risk factors for Strep sinusitis: double sickening, tooth pain, and Strep exposure.    Current Allergies (reviewed today): ! * ALL PAIN MEDS EXCEPT DARVOCET * CEPHOSPORNS * SULFA (SULFONAMIDES) GROUP * CODEINE      Physical Exam  General:     Well-developed,well-nourished,in no acute distress; alert,appropriate and cooperative throughout examination Eyes:     No corneal or conjunctival inflammation noted. EOMI. Perrla.  Vision grossly normal. Ears:     L TM dull Nose:     Dry nares; hyponasal speech Mouth:     Oral mucosa and oropharynx without lesions or exudates.  Teeth in good repair. Lungs:     Normal respiratory effort, chest expands symmetrically. Lungs are clear to auscultation, no crackles or wheezes. Cervical Nodes:     No lymphadenopathy noted Axillary Nodes:  No palpable lymphadenopathy    Impression & Recommendations:  Problem # 1:  SINUSITIS- ACUTE-NOS (ICD-461.9)  Her updated medication list for this problem includes:    Nasonex 50 Mcg/act Susp (Mometasone furoate) ..... Hold    Promethazine-dm 6.25-15 Mg/52ml Syrp (Promethazine-dm) .Marland Kitchen... Take as directed for cough    Amoxicillin 875 Mg Tabs (Amoxicillin) ..... One by mouth bid    Clarithromycin 500 Mg Tb24 (Clarithromycin) .Marland Kitchen... 2 once daily with food   Complete Medication List: 1)  Valtrex 1 Gm Tabs  (Valacyclovir hcl) .Marland Kitchen.. 1 by mouth qd 2)  Clonazepam 0.5 Mg Tabs (Clonazepam) 3)  Ranitidine Hcl 300 Mg Caps (Ranitidine hcl) .... Take 1 capsule by mouth once a day 4)  Clonazepam 0.5 Mg Tabs (Clonazepam) .... 1/2 at bedtime prn 5)  Yaz 3-0.02 Mg Tabs (Drospirenone-ethinyl estradiol) 6)  Nasonex 50 Mcg/act Susp (Mometasone furoate) .... Hold 7)  Albuterol 90 Mcg/act Aers (Albuterol) .... Inhale 2 puff using inhaler every four hours 8)  Asmanex 120 Metered Doses 220 Mcg/inh Aepb (Mometasone furoate) .... Hold 9)  Zegerid 40-1100 Mg Caps (Omeprazole-sodium bicarbonate) .... Qd 10)  Symbicort 160-4.5 Mcg/act Aero (Budesonide-formoterol fumarate) .... Take as directed 11)  Promethazine-dm 6.25-15 Mg/87ml Syrp (Promethazine-dm) .... Take as directed for cough 12)  Amoxicillin 875 Mg Tabs (Amoxicillin) .... One by mouth bid 13)  Ibuprofen  .... Prn 14)  Mucinex D  .... Two times a day prn 15)  Clarithromycin 500 Mg Tb24 (Clarithromycin) .... 2 once daily with food   Patient Instructions: 1)  Drink as much fluid as you can tolerate for the next few days. Discontinue Mucinex D.    Prescriptions: CLARITHROMYCIN 500 MG  TB24 (CLARITHROMYCIN) 2 once daily with food  #20 x 0   Entered and Authorized by:   Marga Melnick MD   Signed by:   Marga Melnick MD on 04/02/2007   Method used:   Print then Give to Patient   RxID:   [id number]  ]

## 2010-05-11 NOTE — Progress Notes (Signed)
Summary: bronchitis  Phone Note Call from Patient Call back at [phone]   Caller: Patient Reason for Call: Acute Illness Summary of Call: dr. hopper pt is coughing up green and feels like she might have bronchitis. she doesnt think she is running a fever. she did go yesterday to have her ct. she is having asthma problems. Mckenna also has an asthma doctor kozlow. she didnt know if she should go to him about the asthma.  Initial call taken by: Charolette Child,  April 24, 2007 8:43 AM  Follow-up for Phone Call        s/w pt said not sob just coughing asthma fairly ok, just congested green sputum, had ct yesterday called results not in pt feels like bronchitis ov sched...................................................................Marland KitchenKandice Hams  April 24, 2007 9:59 AM  Follow-up by: Kandice Hams,  April 24, 2007 9:59 AM

## 2010-05-11 NOTE — Procedures (Signed)
Summary: colonoscopy     PATIENT:  Peggy Wallace, Peggy Wallace  MR#:  [id number] BIRTHDATE:   06/19/1965   GENDER:   female  ENDOSCOPIST:   Venita Lick. Russella Dar, MD, [facility] Referred by:   PROCEDURE DATE:  05/07/2008 PROCEDURE:  Colonoscopy, diagnostic ASA CLASS:   Class II INDICATIONS: 1) family Hx of polyps, father at age 46 2) maternal uncle with colon cancer.  MEDICATIONS:    Fentanyl 75 mcg IV, Versed 10 mg IV, Benadryl 50 mg IV  DESCRIPTION OF PROCEDURE:   After the risks benefits and alternatives of the procedure were thoroughly explained, informed consent was obtained.  Digital rectal exam was performed and revealed no abnormalities.   The LB [id number]-[id number] endoscope was introduced through the anus and advanced to the cecum, which was identified by both the appendix and ileocecal valve, without limitations.  The quality of the prep was excellent, using MoviPrep.  The instrument was then slowly withdrawn as the colon was fully examined. <<PROCEDUREIMAGES>>          <<OLD IMAGES>>  FINDINGS:  A normal appearing cecum, ileocecal valve, and appendiceal orifice were identified. The ascending, hepatic flexure, transverse, splenic flexure, descending, sigmoid colon, and rectum appeared unremarkable. Retroflexed views in the rectum revealed internal hemorrhoids, small. The time to cecum =  3  minutes. The scope was then withdrawn (time =  9.25  min) from the patient and the procedure completed.  COMPLICATIONS:   None  ENDOSCOPIC IMPRESSION:  1) Normal colon  2) Internal hemorrhoids    REPEAT EXAM:   In 5 year(s) for Colonoscopy.   Venita Lick. Russella Dar, MD, [facility]   CC: Pecola Lawless MD

## 2010-05-13 NOTE — Progress Notes (Signed)
Summary: LAB RESULTS  Phone Note Call from Patient Call back at [facility] Phone [phone]   Caller: Patient Summary of Call: PT IS CALLING ABOUT  LAB RESULTS. PT WILL BE AVALIABLE AFTER 3:30 Initial call taken by: Lavell Islam,  April 19, 2010 2:31 PM  Follow-up for Phone Call        All 3 are normal; the free T3 & T4 are low normal.If symptoms persist , recheck these in 3-4 months. It is possible symptoms are due to early hypothyroidism. Hopp  Additional Follow-up for Phone Call Additional follow up Details #1::        Patient notified. Lucious Groves CMA  April 19, 2010 4:34 PM
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
## 2010-05-19 NOTE — Consult Note (Signed)
Summary: [facility]  [facility]   Imported By: Lanelle Bal 05/12/2010 11:02:49  _____________________________________________________________________  External Attachment:    Type:   Image     Comment:   External Document

## 2010-05-25 ENCOUNTER — Encounter: Payer: Self-pay | Admitting: Internal Medicine

## 2010-06-08 NOTE — Letter (Signed)
Summary: [facility]  [facility]   Imported By: Maryln Gottron 06/01/2010 15:49:25  _____________________________________________________________________  External Attachment:    Type:   Image     Comment:   External Document

## 2010-06-21 ENCOUNTER — Encounter (INDEPENDENT_AMBULATORY_CARE_PROVIDER_SITE_OTHER): Payer: Self-pay | Admitting: Internal Medicine

## 2010-06-21 ENCOUNTER — Encounter: Payer: Self-pay | Admitting: Internal Medicine

## 2010-06-21 ENCOUNTER — Other Ambulatory Visit: Payer: Self-pay | Admitting: Internal Medicine

## 2010-06-21 DIAGNOSIS — D802 Selective deficiency of immunoglobulin A [IgA]: Secondary | ICD-10-CM

## 2010-06-21 DIAGNOSIS — R635 Abnormal weight gain: Secondary | ICD-10-CM

## 2010-06-21 DIAGNOSIS — J45909 Unspecified asthma, uncomplicated: Secondary | ICD-10-CM

## 2010-06-21 DIAGNOSIS — Z23 Encounter for immunization: Secondary | ICD-10-CM

## 2010-06-21 DIAGNOSIS — Z0000 Encounter for general adult medical examination without abnormal findings: Secondary | ICD-10-CM

## 2010-06-21 DIAGNOSIS — E785 Hyperlipidemia, unspecified: Secondary | ICD-10-CM

## 2010-06-21 DIAGNOSIS — R05 Cough: Secondary | ICD-10-CM

## 2010-06-21 DIAGNOSIS — R059 Cough, unspecified: Secondary | ICD-10-CM

## 2010-06-21 DIAGNOSIS — K219 Gastro-esophageal reflux disease without esophagitis: Secondary | ICD-10-CM

## 2010-06-21 LAB — CBC WITH DIFFERENTIAL/PLATELET
Basophils Absolute: 0 10*3/uL (ref 0.0–0.1)
Basophils Relative: 0.3 % (ref 0.0–3.0)
Eosinophils Absolute: 0.1 10*3/uL (ref 0.0–0.7)
Eosinophils Relative: 1.8 % (ref 0.0–5.0)
HCT: 37.4 % (ref 36.0–46.0)
Hemoglobin: 12.8 g/dL (ref 12.0–15.0)
Lymphocytes Relative: 31.7 % (ref 12.0–46.0)
Lymphs Abs: 1.8 10*3/uL (ref 0.7–4.0)
MCHC: 34.2 g/dL (ref 30.0–36.0)
MCV: 89.8 fl (ref 78.0–100.0)
Monocytes Absolute: 0.5 10*3/uL (ref 0.1–1.0)
Monocytes Relative: 8.3 % (ref 3.0–12.0)
Neutro Abs: 3.3 10*3/uL (ref 1.4–7.7)
Neutrophils Relative %: 57.9 % (ref 43.0–77.0)
Platelets: 209 10*3/uL (ref 150.0–400.0)
RBC: 4.16 Mil/uL (ref 3.87–5.11)
RDW: 12.9 % (ref 11.5–14.6)
WBC: 5.8 10*3/uL (ref 4.5–10.5)

## 2010-06-21 LAB — HEPATIC FUNCTION PANEL
ALT: 13 U/L (ref 0–35)
AST: 19 U/L (ref 0–37)
Albumin: 4.1 g/dL (ref 3.5–5.2)
Alkaline Phosphatase: 50 U/L (ref 39–117)
Bilirubin, Direct: 0.1 mg/dL (ref 0.0–0.3)
Total Bilirubin: 0.4 mg/dL (ref 0.3–1.2)
Total Protein: 7.3 g/dL (ref 6.0–8.3)

## 2010-06-21 LAB — T3, FREE: T3, Free: 2.8 pg/mL (ref 2.3–4.2)

## 2010-06-21 LAB — BASIC METABOLIC PANEL
BUN: 14 mg/dL (ref 6–23)
CO2: 29 mEq/L (ref 19–32)
Calcium: 8.7 mg/dL (ref 8.4–10.5)
Chloride: 102 mEq/L (ref 96–112)
Creatinine, Ser: 0.6 mg/dL (ref 0.4–1.2)
GFR: 106.54 mL/min (ref >60.00)
Glucose, Bld: 82 mg/dL (ref 70–99)
Potassium: 3.8 mEq/L (ref 3.5–5.1)
Sodium: 136 mEq/L (ref 135–145)

## 2010-06-21 LAB — TSH: TSH: 1.61 u[IU]/mL (ref 0.35–5.50)

## 2010-06-21 LAB — T4, FREE: Free T4: 0.95 ng/dL (ref 0.60–1.60)
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
## 2010-07-14 ENCOUNTER — Ambulatory Visit (INDEPENDENT_AMBULATORY_CARE_PROVIDER_SITE_OTHER): Payer: BC Managed Care – PPO | Admitting: Internal Medicine

## 2010-07-14 ENCOUNTER — Encounter: Payer: Self-pay | Admitting: Internal Medicine

## 2010-07-14 DIAGNOSIS — J040 Acute laryngitis: Secondary | ICD-10-CM

## 2010-07-14 DIAGNOSIS — J45909 Unspecified asthma, uncomplicated: Secondary | ICD-10-CM

## 2010-07-14 MED ORDER — PREDNISONE 20 MG PO TABS: 20.0000 mg | ORAL_TABLET | Freq: Every day | ORAL | Status: AC

## 2010-07-14 MED ORDER — DOXYCYCLINE HYCLATE 100 MG PO TABS: 100.0000 mg | ORAL_TABLET | Freq: Two times a day (BID) | ORAL | Status: AC

## 2010-07-14 MED ORDER — METRONIDAZOLE 500 MG PO TABS: 500.0000 mg | ORAL_TABLET | Freq: Three times a day (TID) | ORAL | Status: AC

## 2010-07-14 NOTE — Assessment & Plan Note (Addendum)
Episodes of acute laryngitis and mild asthma exacerbation. She has a number of allergies to antibiotics and a history of C. difficile. I suggested observation and conservative treatment, patient states that historically she gets sicker w/ that approach. Also due to the history of C. Diff she typically gets antibiotics and Flagyl to prevent GI problems. With this information in mind, I will treat her with doxycycline, Flagyl, prednisone. See instructions

## 2010-07-14 NOTE — Patient Instructions (Signed)
Antibiotics as prescribed Prednisone for 5 days Take Nasonex and Symbicort daily as prescribed xopenex  inhaler as needed Call anytime if you are getting worse. Call if not better in few  days or if you develop diarrhea

## 2010-07-14 NOTE — Assessment & Plan Note (Signed)
Take most of her asthma medicines as needed. See instructions
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
## 2010-07-19 ENCOUNTER — Telehealth: Payer: Self-pay | Admitting: *Deleted

## 2010-07-19 NOTE — Telephone Encounter (Signed)
Message copied by Army Fossa on Mon Jul 19, 2010  1:57 PM ------      Message from: Peggy Wallace      Created: Thu Jul 15, 2010  1:58 PM       Check on her, better?

## 2010-07-19 NOTE — Telephone Encounter (Signed)
Spoke w/ pt she states her voice is back, but she has now picked up a cold. She will call if she does not continue to get better.

## 2010-07-22 LAB — CBC
HCT: 39.5 % (ref 36.0–46.0)
Hemoglobin: 13.4 g/dL (ref 12.0–15.0)
MCHC: 34 g/dL (ref 30.0–36.0)
MCV: 91.6 fL (ref 78.0–100.0)
Platelets: 198 10*3/uL (ref 150–400)
RBC: 4.31 MIL/uL (ref 3.87–5.11)
RDW: 11.8 % (ref 11.5–15.5)
WBC: 5.4 10*3/uL (ref 4.0–10.5)

## 2010-08-24 NOTE — Op Note (Signed)
NAMEMAKENLY, LARABEE           ACCOUNT NO.:  [id number]   MEDICAL RECORD NO.:  [id number]          PATIENT TYPE:  AMB   LOCATION:  SDC                           FACILITY:  [facility]   PHYSICIAN:  Maxie Better, M.D.DATE OF BIRTH:  [birth year]   DATE OF PROCEDURE:  06/20/2008  DATE OF DISCHARGE:                               OPERATIVE REPORT   PREOPERATIVE DIAGNOSES:  Dysfunctional uterine bleeding, question  endometrial polyp.   PROCEDURE:  Diagnostic hysteroscopy, dilation and curettage.   POSTOPERATIVE DIAGNOSES:  Dysfunctional uterine bleeding, endometrial  polyp.   ANESTHESIA:  General.   SURGEON.:  Maxie Better, MD   ASSISTANT:  None.   INDICATIONS:  A 45 year old female with history of an Essure procedure  done in about 2007 with dysfunctional uterine bleeding, who was found an  endometrial biopsy to have fragments of endometrial polyp despite a  endometrial stripe on ultrasound of 4 mm, who now presents for surgical  management due to the fact of continued vaginal bleeding.  Surgical  risks was reviewed with the patient.  Consent was signed and the patient  was transferred to the operating room.   PROCEDURE IN DETAIL:  Under adequate general anesthesia, the patient was  placed in the dorsal lithotomy position.  She was sterilely prepped and  draped in the usual fashion.  Bladder was catheterized with small amount  of urine.  Examination under anesthesia revealed a normal-sized uterus.  No adnexal masses could be appreciated.  Bivalve speculum was placed in  the vagina.  Single-tooth tenaculum was placed on the anterior lip of  the cervix.  The cervix was then dilated up to #25 [facility] dilator.  A  sorbitol prime diagnostic hysteroscope was introduced into the uterine  cavity.  The left tubal ostia could be well seen.  There was no evidence  of the Essure apparatus there.  On the right, the apparatus was  projecting out from the os with some of the serially  uncoiled noted.  There was a polypoid lesion in the anterior aspect of the uterus closer  to the lower uterine segment.  The diagnostic hysteroscope was removed  to resectoscope.  The cervix was then serially dilated up to #29 [facility]  dilator and the resectoscope was inserted with the intent of resecting  the polyp without further encountering the Essure.  When the  resectoscope was put in , the polypoid lesion which had been cut of  partially off was already completely removed from this with the cervical  dilators.  At that point, the resectoscope was removed.  The cavity was  gently curetted.  The resectoscope was then reinserted to check on that  Essure apparatus on the right and it was still present in the same  fashion.  At that point, the procedure was then terminated by removing  all instruments from the vagina.  Specimen labeled endometrial curetting  and a tissue like a polypoid that was found in the vagina from egress of  the fluid was sent to Pathology.  Estimated blood loss was minimal.  Fluid deficit of 100.  Complication was none.  The patient tolerated the  procedure well and was transferred to the recovery room in stable  condition.  The representative for the Essure  apparatus was called in question regarding the efficacy of this  findings.  However, given the patient already has had documentation of  occlusion of both tubes, it was felt that she was fine enough being  further needed to be done.  This information was imparted to the  patient.      Maxie Better, M.D.  Electronically Signed     [place]/MEDQ  D:  06/20/2008  T:  06/21/2008  Job:  [id number]

## 2010-08-24 NOTE — Assessment & Plan Note (Signed)
[facility]                                 ON-CALL NOTE   Peggy Wallace, Peggy Wallace                    MRN:          [id number]  DATE:04/08/2007                            DOB:          1966-01-13    PRIMARY CARE PHYSICIAN:  Dr. Alwyn Ren.   Peggy Wallace is a patient of Dr. Frederik Pear who reports that she has been  dealing with a sinus infection. She was put on amoxicillin. The  medication was not effective and she was recently switched over to  clarithromycin. She reports that she noted diarrhea over the last couple  of days and was afraid to continue taking the clarithromycin for today.  I did advise the patient that she can hold the antibiotic therapy for  today and call Dr. Alwyn Ren tomorrow morning for further recommendations.  She is not having any nausea, vomiting, abdominal pain, or any other  symptoms.     Leanne Chang, M.D.  Electronically Signed    [place]/MedQ  DD: 04/08/2007  DT: 04/09/2007  Job #: [id number]

## 2010-08-24 NOTE — Assessment & Plan Note (Signed)
[facility]                         GASTROENTEROLOGY OFFICE NOTE   NAME:Peggy Wallace, Peggy Wallace                  MRN:          [id number]  DATE:11/09/2006                            DOB:          [birth year]    She returns today complaining of intermittent epigastric pain that  generalizes across her abdomen that is associated with gas, bloating,  and urgent, loose, non-bloody bowel movements.  These symptoms often  occur after meals.  She notes no weight loss, melena, hematochezia,  nausea, vomiting, fevers or chills.  She denies any recent antibiotic  usage.  She has prolonged intervals with no significant gastrointestinal  complaints whatsoever.   CURRENT MEDICATIONS:  Listed on the chart, updated and reviewed.   MEDICATION ALLERGIES:  SULFA DRUGS, CEPHALOSPORINS, CODEINE.   PHYSICAL EXAMINATION:  No acute distress, weight 166 pounds, blood  pressure is 112/62, pulse 68 and regular.  HEENT:  Anicteric sclerae, oropharynx clear.  CHEST:  Clear to auscultation bilaterally.  CARDIAC:  Regular rate and rhythm without murmurs.  ABDOMEN:  Soft, nontender, nondistended, normoactive bowel sounds, no  palpable organomegaly, masses, or hernias.   ASSESSMENT AND PLAN:  1. Presumed irritable bowel syndrome.  She is advised to begin a low-      gas diet.  Use Gas-X q.i.d. p.r.n.  Begin hyoscyamine 1-2      sublingually q.4 h. p.r.n.  Return office visit in 6-8 weeks.  If      her symptoms have not adequately responded, consider further      evaluation with an abdominal ultrasound and blood work.  In      addition, she will being Florastor 1 p.o. q.a.m. for 30 days.  2. Gastroesophageal reflux disease.  Symptoms under control on      Prilosec q.a.m. and ranitidine nightly.  Continue current      management.     Venita Lick. Russella Dar, MD, [facility]  Electronically Signed    [facility]/MedQ  DD: 11/09/2006  DT: 11/10/2006  Job #: [id number]

## 2010-08-27 NOTE — Op Note (Signed)
NAMESHURONDA, SANTINO           ACCOUNT NO.:  [id number]   MEDICAL RECORD NO.:  [id number]          PATIENT TYPE:  OIB   LOCATION:  2550                         FACILITY:  [facility]   PHYSICIAN:  Burnard Bunting, M.D.    DATE OF BIRTH:  [birth year]   DATE OF PROCEDURE:  01/20/2004  DATE OF DISCHARGE:                                 OPERATIVE REPORT   PREOPERATIVE DIAGNOSIS:  Left hip internal derangement.   POSTOPERATIVE DIAGNOSIS:  Left hip partial tear ligamentum teres.   PROCEDURE:  Left hip arthroscopy with partial debridement of the ligamentum  teres with preservation of its arterial supply.   SURGEON:  Dorene Grebe, M.D.   ASSISTANT:  None.   ANESTHESIA:  General endotracheal.   ESTIMATED BLOOD LOSS:  Minimal.   PROCEDURE IN DETAIL:  The patient was brought to the operating room where  general endotracheal anesthesia was induced.  The patient was placed in the  supine position on the fracture table with the right leg in the lithotomy  position with the peroneal nerve well padded.  The patient was positioned on  the fracture table, lateralized to the right with the peroneal post at the  thigh region.  The leg was held in neutral rotation and neutral  flexion/extension, and 0.5 degrees of abduction.  Without prepping, traction  was applied and the vacuum sign was identified, indicating adequate traction  would be obtainable.  At this time, the traction was released and the  patient's left hip and thigh region was prepped with Duraprep solution and  sterile manner.  The vascular anatomy of the left hip region was localized  under fluoroscopic guidance including the anterior and posterior aspect of  the greater trochanter and the tip of the greater trochanter.  The anterior  superior iliac crest was also palpated and marked and a spot about 6.5 cm  distal and slightly lateral to this area was marked for entry of the  anterior portal.  The joint was distracted, the spinal needle  was placed to  the location of the anterolateral needle and slightly superior to the tip of  the greater trochanter and aiming toward the joint with hand parallel to the  floor.  Then 20 cc of saline with epinephrine was then injected into the  joint under fluoroscopic guidance and subsequent distention of the joint.  At this time, the nitinol wire was placed through the spinal needle, and  then subsequent dilation was performed.  The arthroscopic trocar was then  placed into the hip joint with good visualization obtained.  At this time,  diagnostic arthroscopy was performed.  The visualized portion of the femoral  head was intact.  The visualized portion of the __________ and posterior  labrum was intact.  Ligamentum teres appeared inflamed and partially torn.  At this time, the anterior portal was created.  Using fluoroscopic guidance,  the anterior portal was created in the position described at the  intersection of the lateral aspect of the anterior superior iliac crest and  line drawn across the tip of the trochanters.  With the angle 45 degrees in  the  sagittal plane and 30 degrees in the coronal plane, the anterior aspect  of the capsule was punctured.  Subsequent nitinol wire placement and  dilation was performed.  In this manner, the anterior portal was  established.  Using a combination of 30 and 70 degree scope, full inspection  of the central and peripheral compartments was performed.  The attachments  around the neck were intact.  The anterior and posterior and inferior aspect  and lateral aspects of the labrum were intact.  There were no defects on the  chondral surface of the head.  The transversus ligament was intact.  The  labral structures were both visualized and probed and found to have no  degenerative tearing.  The only atypical appearing structure was the  ligamentum teres which did appear to have inflammatory redundant synovium  around it.  Using the ArthroCare wand,  this inflamed synovium was ablated.  Care was taken to avoid injury to the arterial supply to the femoral head.  Following ablation of this tissue, the joint was thoroughly irrigated.  The  blade was taken through a range of motion and again no chondral defects or  labral pathology was identified.  Intraoperative photos were obtained.  The  instruments were then removed and the hip was injected with a solution of  Marcaine, morphine and clonidine.  The portals were closed using interrupted  inverted 2-0 Vicryl suture and 3-0 nylon suture.  Patient tolerated the  procedure well without any complications.       [facility]/MEDQ  D:  01/20/2004  T:  01/20/2004  Job:  [id number]

## 2010-08-27 NOTE — Cardiovascular Report (Signed)
NAMEPINKEY, MCJUNKIN           ACCOUNT NO.:  [id number]   MEDICAL RECORD NO.:  [id number]          PATIENT TYPE:  AMB   LOCATION:  ENDO                         FACILITY:  [facility]   PHYSICIAN:  Malcolm T. Russella Dar, M.D. [facility] OF BIRTH:  1966-04-07   DATE OF PROCEDURE:  DATE OF DISCHARGE:  09/16/2004                              CARDIAC CATHETERIZATION   PROCEDURE:  24 hour ambulatory pH monitoring.   INDICATIONS FOR PROCEDURE:  Chest pain, chronic cough, regurgitation, and  belching.   The patient completed the 24 hour study and analysis for 22 hours 55 minutes  is provided in the report.   Proximal channel:  The percent of time below pH 40.1%, total number of  reflux episodes 2, the reflux episodes were brief.   Distal channel:  Percent of time below pH for 40.1%, longest reflux episode  1 minute, total reflux episodes 5.   Composite score analysis by the [facility] scoring reveals a score of  0.9 with normal less than 22.   ASSESSMENT/PLAN:  No evidence of significant acid reflux demonstrated on  this study.       MTS/MEDQ  D:  09/30/2004  T:  09/30/2004  Job:  [id number]

## 2010-08-27 NOTE — Op Note (Signed)
   Peggy Wallace, Peggy Wallace                     ACCOUNT NO.:  [id number]   MEDICAL RECORD NO.:  [id number]                   PATIENT TYPE:  AMB   LOCATION:  SDC                                  FACILITY:  [facility]   PHYSICIAN:  Lenoard Aden, M.D.             DATE OF BIRTH:  1965-09-14   DATE OF PROCEDURE:  12/05/2002  DATE OF DISCHARGE:                                 OPERATIVE REPORT   PREOPERATIVE DIAGNOSIS:  Desire for sterilization.   POSTOPERATIVE DIAGNOSIS:  Desire for sterilization.   PROCEDURE:  Diagnostic hysteroscopy, Essure tubal sterilization.   SURGEON:  Lenoard Aden, M.D.   ANESTHESIA:  General.   ESTIMATED BLOOD LOSS:  Less than 50 mL.   COMPLICATIONS:  None.   DRAINS:  None.   COUNTS:  Correct.   Patient to recovery in good condition.   BRIEF OPERATIVE NOTE:  After being apprised of the risks and benefits of the  Essure tubal system versus laparoscopic tubal ligation, the patient consents  and wishes to proceed.  After achieving adequate anesthesia, exam under  anesthesia reveals an acute anteflexed uterus, no adnexal masses.  Cervix is  easily dilated up to a #21 Pratt dilator.  The hysteroscope passed.  Visualization reveals normal uterus and bilateral normal-appearing tubal  ostia.  The Essure device is entered through the operative port and placed  in the standard fashion into the left tube.  There is some evidence of  remaining coils outside of the tubal ostia, which are approximately 13,  within normal for the procedure.  The same procedure is performed that was  on the right tube on the left tube with initial application unsuccessful and  second application was successful with approximately three coils left  outside the tube.  Good hemostasis is noted, no evidence of bleeding, a  fluid deficit of approximately 50 mL.  All instruments are removed.  The  patient tolerates the procedure well and was transferred to recovery in good   condition.                                               Lenoard Aden, M.D.    RJT/MEDQ  D:  12/05/2002  T:  12/05/2002  Job:  [id number]

## 2010-08-27 NOTE — Discharge Summary (Signed)
[facility]  PatientAVEYA Wallace                     MRN: [id number] Adm. Date:  [id number] Disc. Date: [id number] Attending:  Ardeen Fillers CC:         Peggy Wallace, M.D. at her office, [facility]                           Discharge Summary  DISCHARGE DIAGNOSES:          1. Intrauterine pregnancy at [redacted] weeks gestational                                  age.                               2. Preterm rupture of membranes.                               3. Recent history of genital herpes outbreak.                               4. RH positive.                               5. Asthma.  OPERATIVE PROCEDURE:          Primary low transverse cesarean section on April 24, 1999.  HISTORY OF PRESENT ILLNESS:   A 45 year old woman, G1 P0, [facility] May 30, 1999 by ultrasound at [redacted] weeks gestational age with complaints of spontaneous rupture of membranes for clear fluid on April 24, 1999 at 61 and six-sevenths weeks gestational age.  There were no uterine contractions.                                The antenatal course had been remarkable for a herpetic outbreak within the two weeks prior to admission date.  The patient was treated with Valtrex twice daily for five days and although she was without signs or symptoms of the herpes at that time, cesarean delivery was recommended for fetal indications.  Patient was consented and taken to the operating room, where she underwent primary low transverse cesarean section by Dr. Lyndon Code and Hayes Ludwig, Family Nurse Practitioner.  Spinal anesthesia was used and estimated  blood loss was 600 cc.  The baby was a live, vertex female, 2455 g, with Apgars of 8 and 9.  Clear fluid was encountered.  Placenta was sent to pathology and later revealed a mature placenta.                                Postoperative course was unremarkable. Hemoglobin stabilized at 11.9.  Patient was discharged to  home on postoperative day #3 in satisfactory condition.  INSTRUCTIONS:                 Routine status post C section instructions were given.  She is experiencing some chest tightness prior to her discharge but this was  felt to be asthma-related.  MEDICATIONS:                  She was given a Medrol Dosepak for treatment of the asthma.  Other medications were prenatal vitamins and Darvocet-N 100 #20 tablets, Motrin 600 mg q.6h. for several days, and over-the-counter Colace.  FOLLOW-UP:                    Staple removal was done prior to her discharge. he will be followed up by the [facility] group in four to six  weeks. DD:  06/01/99 TD:  06/02/99 Job: [id number]

## 2010-08-27 NOTE — Op Note (Signed)
[facility]  PatientCHARLENA Wallace                     MRN: [id number] Proc. Date: 04/24/99 Adm. Date:  [id number] Attending:  Ardeen Fillers                           Operative Report  PREOPERATIVE DIAGNOSIS:       Intrauterine pregnancy at 34+ weeks gestational age. History of genital herpes outbreak less than two weeks ago.  Preterm rupture of  membranes.  POSTOPERATIVE DIAGNOSIS:      Intrauterine pregnancy at 34+ weeks gestational age. History of genital herpes outbreak less than two weeks ago.  Preterm rupture of  membranes.  OPERATION:                    Primary low transverse cesarean section.  SURGEON:                      Sung Amabile. Roslyn Smiling, M.D.  ASSISTANT:                    Hayes Ludwig, F.N.P.  ANESTHESIA:                   Spinal.  ESTIMATED BLOOD LOSS:         600 cc.  TUBES AND DRAINS:             Foley.  COMPLICATIONS:                None.  FINDINGS:                     Live, vertex female, 2455 grams with Apgars of 8 and 9. Fluid clear.  Normal tubes, ovaries, and uterus.  Placenta intact with three vessel cord.  Normal intrauterine contour.  SPECIMENS:                    Cord bloods.  Placenta to pathology given preterm  rupture of membranes.  INDICATIONS:                  A 45 year old woman, gravida 1, para 0, [facility] May 30, 1999, by ultrasound at [redacted] weeks gestational age with complaints of spontaneous rupture of membranes for clear fluid this morning at 5 a.m.  She had noted no uterine contractions and presented to the ER at 34-6/[redacted] weeks gestational age. Rupture of membranes were confirmed.  The patient has had a recent herpes outbreak which occurred less than two weeks  ago.  She was treated with Valtrex b.i.d. for five days, but because the outbreak proximity to the rupture of membranes, concern regarding possible herpes infection of the fetus was raised.  Given less than two weeks hiatus  between symptomatic herpes outbreak and preterm rupture of membranes, the recommendation for cesarean section was given for fetal indications.  This was discussed with the patient and her husband.  Questions have been answered and an informed consent obtained.  DESCRIPTION OF PROCEDURE:     After the establishment of spinal anesthesia, the  patient was placed in the left uterine displacement position.  The abdomen was prepped, Foley catheter was placed, and the patient was draped.  A Pfannenstiel  incision was made on the skin with a knife.  The incision was carried to the level of the fascia using  electrocautery.  The fascia was nicked in the midline and the fascial incision was carried laterally with scissors.  The fascia was separated  from the underlying rectus muscle bellies with a combination of sharp and blunt  dissection.  The upper border of the fascia was retracted cephalad.  The rectus  muscle bellies were split in the midline sharply.  The peritoneum was visualized, grasped in a clear space, and entered three bluntly.  The peritoneal incision was carried superiorly and inferiorly taking care to avoid injury to underlying viscera.  The bladder blade was placed.  The uterine position was ascertained. The vesicouterine serosa was opened over the lower uterine segment transversely. The bladder flap was developed bluntly and the bladder blade was placed in the bladder flap.  A low transverse incision was made on the uterus with a knife.  The incision was carried laterally with bandage scissors.  The vertex was delivered and then the  baby delivered.  The cord was clamped and cut and the baby was handed off the table to the awaiting pediatricians.  The nasopharynx was suctioned prior to handing he baby to the pediatricians.  Cord bloods were taken.  The placenta was removed spontaneously.  The uterine cavity was wiped clean of clot and noted to be regular. Ring  forceps were used to identify the uterine incisional angles.  Bladder blades were placed.  The cervix was dilated with Kelly clamp.  The uterus was closed in layers.  The first layer was a running interlocking stitch of 0 Monocryl.  The second layer was an imbricating stitch of 0 Monocryl. Bleeding from the left incisional angle was noted.  This was controlled with two mattress sutures of 2-0 Monocryl.  Hemostasis was noted.  The uterus had been exteriorized and was then replaced.  The reproductive organs were noted to be normal.  The peritoneal cavity was irrigated with warm normal saline.  Hemostasis was noted.  Initial sponge, needle, and instrument counts were correct.  The anterior abdominal wall was closed in layers.  The subfascial layer was irrigated with warm normal saline.  Electrocautery was used for hemostasis. The rectus muscle bellies was reapproximated with a mattress suture of 0 Monocryl. The fascia was closed from either angle using a running stitch of 0 Vicryl, tying in the midline independently.  The fat was irrigated with warm normal saline. Electrocautery was used for hemostasis.  0.25% Marcaine (10 cc) was infiltrated  into the skin edges.  The skin was closed with staples.  Final sponge, needle, nd instrument counts were correct.  The urine was clear at the end of the case. The patient was transported to the recovery room in satisfactory condition. DD:  04/24/99 TD:  04/24/99 Job: [id number]

## 2010-08-27 NOTE — H&P (Signed)
   Peggy Wallace, Peggy Wallace                     ACCOUNT NO.:  [id number]   MEDICAL RECORD NO.:  [id number]                   PATIENT TYPE:  AMB   LOCATION:  SDC                                  FACILITY:  [facility]   PHYSICIAN:  Lenoard Aden, M.D.             DATE OF BIRTH:  1966/02/19   DATE OF ADMISSION:  12/05/2002  DATE OF DISCHARGE:                                HISTORY & PHYSICAL   CHIEF COMPLAINT:  Desire for elective sterilization.   HISTORY OF PRESENT ILLNESS:  The patient is a 45 year old white female, G1,  P1 who presents for desire for elective sterilization.   PAST MEDICAL/SURGICAL HISTORY:  Remarkable for one uncomplicated cesarean  section. The patient has a remote history of abnormal Pap smear. She also  has a history of a tonsillectomy and a history of asthma. She had nasal  surgery at age 23 and then again, history of breast implants and history of  toe surgery as noted.   MEDICATIONS:  Nexium and Ortho-Evra.   FAMILY HISTORY:  History of heart disease, hypertension, varicosities,  emphysema and lung cancer.   PHYSICAL EXAMINATION:  GENERAL:  This is a well-developed, well-nourished  white female in no acute distress.  HEENT:  Normal.  LUNGS:  Clear.  HEART:  Regular rhythm.  ABDOMEN:  Soft, nontender.  PELVIC:  Uterus anteflexed. No adnexal masses.   IMPRESSION:  Desire for elective sterilization.   PLAN:  Proceed with Essure tubal ligation versus laparoscopic tubal  ligation.  The risks of anesthesia, infection, bleeding, intraabdominal  injuries or need for repair are discussed. Failure risk of tubal ligation of  5-10/1,000 noted.  Delayed versus immediate complications to include bowel  and bladder injury, thus inability to place Essure implants with possible  need for laparoscopic tubal sterilization noted.                                               Lenoard Aden, M.D.    RJT/MEDQ  D:  12/04/2002  T:  12/04/2002  Job:  [id number]

## 2011-02-17 ENCOUNTER — Ambulatory Visit (INDEPENDENT_AMBULATORY_CARE_PROVIDER_SITE_OTHER): Payer: BC Managed Care – PPO

## 2011-02-17 DIAGNOSIS — Z23 Encounter for immunization: Secondary | ICD-10-CM

## 2011-02-22 ENCOUNTER — Telehealth: Payer: Self-pay

## 2011-02-22 NOTE — Telephone Encounter (Signed)
Discuss with patient, advise to try OTC pain medsalong with heating pad to help with neck and if no relief OV will be needed.

## 2011-02-22 NOTE — Telephone Encounter (Signed)
Pt called and stated she had a flu shot on 02/17/11. Pt states after this she experienced a sore throat that comes on and off and a stiff neck that is very painful especially on the right side where her flu shot was given. Pt is putting a heating pad on her neck that seems to be helping.  Pt states she would like to report this and make sure she does not need to be seen. Pls advise.

## 2011-02-22 NOTE — Telephone Encounter (Signed)
Zicam Melts or Zinc lozenges ; vitamin C 2000 mg daily; & Echinacea for 4-7 days. Office appt if fever, exudate("pus") or progressive pain.

## 2011-03-16 ENCOUNTER — Encounter: Payer: Self-pay | Admitting: Internal Medicine

## 2011-03-20 ENCOUNTER — Ambulatory Visit (INDEPENDENT_AMBULATORY_CARE_PROVIDER_SITE_OTHER): Payer: BC Managed Care – PPO

## 2011-03-20 DIAGNOSIS — R070 Pain in throat: Secondary | ICD-10-CM

## 2011-03-20 DIAGNOSIS — R509 Fever, unspecified: Secondary | ICD-10-CM

## 2011-03-21 ENCOUNTER — Ambulatory Visit (INDEPENDENT_AMBULATORY_CARE_PROVIDER_SITE_OTHER): Payer: BC Managed Care – PPO | Admitting: Family Medicine

## 2011-03-21 ENCOUNTER — Encounter: Payer: Self-pay | Admitting: Family Medicine

## 2011-03-21 VITALS — BP 115/75 | HR 79 | Temp 98.1°F | Ht 64.75 in | Wt 179.4 lb

## 2011-03-21 DIAGNOSIS — J029 Acute pharyngitis, unspecified: Secondary | ICD-10-CM

## 2011-03-21 NOTE — Progress Notes (Signed)
  Subjective:    Patient ID: Peggy Wallace, female    DOB: 12/30/1965, 45 y.o.   MRN: [id number]  HPI Sore throat- sxs started Saturday afternoon.  Temp on Saturday night was 102.  Took ibuprofen for fever and pain w/ swallowing.  Went to [facility] yesterday- negative for strep and flu.  Was started on Tamiflu for early flu despite absence of cough.  No ear pain, sinus pain.  No known sick contacts.   Review of Systems For ROS see HPI     Objective:   Physical Exam  Vitals reviewed. Constitutional: She appears well-developed and well-nourished. No distress.  HENT:  Head: Normocephalic and atraumatic.  Nose: Nose normal.  Mouth/Throat: No oropharyngeal exudate.       TMs normal bilaterally No TTP over sinuses Pharynx erythematous w/out exudate  Neck: Normal range of motion. Neck supple.  Cardiovascular: Normal rate, regular rhythm and normal heart sounds.   Pulmonary/Chest: Effort normal and breath sounds normal. No respiratory distress. She has no wheezes. She has no rales.  Lymphadenopathy:    She has no cervical adenopathy.          Assessment & Plan:

## 2011-03-21 NOTE — Patient Instructions (Signed)
This is a viral illness Finish the Tamiflu as directed (although I agree that this isn't the flu) Alternate tylenol and ibuprofen every 4 hrs for pain and fever Drink plenty of fluids REST! Call with any questions or concerns Hang in there! Happy Holidays!

## 2011-03-21 NOTE — Assessment & Plan Note (Signed)
Most likely viral due to 2 negative rapid strep tests.  No need for abx.  Reviewed supportive care and red flags that should prompt return.  Pt expressed understanding and is in agreement w/ plan.

## 2011-03-23 ENCOUNTER — Telehealth: Payer: Self-pay | Admitting: Family Medicine

## 2011-03-23 NOTE — Telephone Encounter (Signed)
Please advise 

## 2011-03-23 NOTE — Telephone Encounter (Signed)
Not sure what she means by 'getting worse'.  Need more info.  She has had 2 negative strep tests and a negative flu test.

## 2011-03-23 NOTE — Telephone Encounter (Signed)
Pt called back and offered to send pt to get a chest xray per reply from MD: Not sure what she means by 'getting worse'. Need more info. She has had 2 negative strep tests and a negative flu test. Pt refused chest xray, pt wanted to clarify how long she could expect to see these symptoms per she never felt like this before with this bad of a sore throat. Advised pt this issue can take longer to resolve per only started on Sunday, however if she would like she could contact MD hopper tomorrow because he is familiar with her history per unable to give pt ABT without office visit or positive chest x-ray. Pt stated she will call back tommorow if she does not get any better

## 2011-03-23 NOTE — Telephone Encounter (Signed)
Pt has a viral illness.  Viruses typically last 7-10 days.  Pt was told this at time of OV on Monday.  It has only been 2 days since visit and 3-4 days since onset of sxs.  She should continue to alternate tylenol or ibuprofen for pain relief.  If her cough is worse she will need a CXR or OV prior to sending abx b/c it is not medically appropriate to send meds w/out knowing what we are treating.  Had 2 negative strep tests (one on Monday and one at [facility]).

## 2011-03-23 NOTE — Telephone Encounter (Signed)
error 

## 2011-05-18 ENCOUNTER — Encounter: Payer: Self-pay | Admitting: Internal Medicine

## 2011-05-31 ENCOUNTER — Encounter: Payer: Self-pay | Admitting: Internal Medicine

## 2011-09-10 ENCOUNTER — Ambulatory Visit (INDEPENDENT_AMBULATORY_CARE_PROVIDER_SITE_OTHER): Payer: BC Managed Care – PPO | Admitting: Physician Assistant

## 2011-09-10 VITALS — BP 94/60 | HR 75 | Temp 98.6°F | Resp 16 | Ht 64.0 in | Wt 149.0 lb

## 2011-09-10 DIAGNOSIS — S61209A Unspecified open wound of unspecified finger without damage to nail, initial encounter: Secondary | ICD-10-CM

## 2011-09-10 DIAGNOSIS — M25549 Pain in joints of unspecified hand: Secondary | ICD-10-CM
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
## 2011-09-20 ENCOUNTER — Ambulatory Visit (INDEPENDENT_AMBULATORY_CARE_PROVIDER_SITE_OTHER): Payer: BC Managed Care – PPO | Admitting: Family Medicine

## 2011-09-20 VITALS — BP 93/55 | HR 74 | Temp 98.2°F | Resp 16 | Ht 64.5 in | Wt 143.6 lb

## 2011-09-20 DIAGNOSIS — IMO0002 Reserved for concepts with insufficient information to code with codable children: Secondary | ICD-10-CM

## 2011-09-20 DIAGNOSIS — Z4802 Encounter for removal of sutures: Secondary | ICD-10-CM
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
## 2012-06-21 ENCOUNTER — Encounter: Payer: Self-pay | Admitting: Internal Medicine

## 2012-07-05 ENCOUNTER — Encounter: Payer: Self-pay | Admitting: Internal Medicine

## 2012-07-05 ENCOUNTER — Ambulatory Visit (INDEPENDENT_AMBULATORY_CARE_PROVIDER_SITE_OTHER): Payer: BC Managed Care – PPO | Admitting: Internal Medicine

## 2012-07-05 VITALS — BP 110/80 | HR 68 | Ht 64.5 in | Wt 171.0 lb

## 2012-07-05 DIAGNOSIS — I498 Other specified cardiac arrhythmias: Secondary | ICD-10-CM

## 2012-07-05 DIAGNOSIS — I471 Supraventricular tachycardia: Secondary | ICD-10-CM
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
## 2012-07-05 NOTE — Patient Instructions (Addendum)
Your physician has recommended that you have an SVT ablation. Catheter ablation is a medical procedure used to treat some cardiac arrhythmias (irregular heartbeats). During catheter ablation, a long, thin, flexible tube is put into a blood vessel in your groin (upper thigh), or neck. This tube is called an ablation catheter. It is then guided to your heart through the blood vessel. Radio frequency waves destroy small areas of heart tissue where abnormal heartbeats may cause an arrhythmia to start. Please see the instruction sheet given to you today.  Call Anselm Pancoast at [phone] if you decide to proceed

## 2012-07-10 ENCOUNTER — Other Ambulatory Visit: Payer: Self-pay | Admitting: *Deleted

## 2012-07-10 ENCOUNTER — Encounter: Payer: Self-pay | Admitting: *Deleted

## 2012-07-10 ENCOUNTER — Telehealth: Payer: Self-pay | Admitting: Internal Medicine

## 2012-07-10 DIAGNOSIS — I471 Supraventricular tachycardia: Secondary | ICD-10-CM

## 2012-07-10 NOTE — Telephone Encounter (Signed)
sherri is going to call pt and schedule

## 2012-07-10 NOTE — Telephone Encounter (Signed)
Pt is scheduled on 4/18.

## 2012-07-10 NOTE — Telephone Encounter (Signed)
New problem     Calling back to schedule procedure .

## 2012-07-12 ENCOUNTER — Telehealth: Payer: Self-pay | Admitting: Internal Medicine

## 2012-07-12 NOTE — Telephone Encounter (Signed)
New Prob   Pt would like to know what she should do after having SBT ablation procedure done as far as following up. Would like to speak to nurse.

## 2012-07-12 NOTE — Telephone Encounter (Signed)
Will be done at the hospital or shortly after discharge

## 2012-07-13 ENCOUNTER — Encounter (HOSPITAL_COMMUNITY): Payer: Self-pay

## 2012-07-19 ENCOUNTER — Other Ambulatory Visit (INDEPENDENT_AMBULATORY_CARE_PROVIDER_SITE_OTHER): Payer: BC Managed Care – PPO

## 2012-07-19 DIAGNOSIS — I498 Other specified cardiac arrhythmias: Secondary | ICD-10-CM

## 2012-07-19 DIAGNOSIS — I471 Supraventricular tachycardia: Secondary | ICD-10-CM

## 2012-07-19 LAB — CBC WITH DIFFERENTIAL/PLATELET
Basophils Absolute: 0 10*3/uL (ref 0.0–0.1)
Basophils Relative: 0.1 % (ref 0.0–3.0)
Eosinophils Absolute: 0.1 10*3/uL (ref 0.0–0.7)
Eosinophils Relative: 0.8 % (ref 0.0–5.0)
HCT: 38.6 % (ref 36.0–46.0)
Hemoglobin: 13 g/dL (ref 12.0–15.0)
Lymphocytes Relative: 31.3 % (ref 12.0–46.0)
Lymphs Abs: 2 10*3/uL (ref 0.7–4.0)
MCHC: 33.6 g/dL (ref 30.0–36.0)
MCV: 86.9 fl (ref 78.0–100.0)
Monocytes Absolute: 0.6 10*3/uL (ref 0.1–1.0)
Monocytes Relative: 9.3 % (ref 3.0–12.0)
Neutro Abs: 3.8 10*3/uL (ref 1.4–7.7)
Neutrophils Relative %: 58.5 % (ref 43.0–77.0)
Platelets: 217 10*3/uL (ref 150.0–400.0)
RBC: 4.45 Mil/uL (ref 3.87–5.11)
RDW: 13.1 % (ref 11.5–14.6)
WBC: 6.5 10*3/uL (ref 4.5–10.5)

## 2012-07-19 LAB — BASIC METABOLIC PANEL
BUN: 14 mg/dL (ref 6–23)
CO2: 28 mEq/L (ref 19–32)
Calcium: 8.9 mg/dL (ref 8.4–10.5)
Chloride: 101 mEq/L (ref 96–112)
Creatinine, Ser: 0.7 mg/dL (ref 0.4–1.2)
GFR: 92.15 mL/min (ref >60.00)
Glucose, Bld: 78 mg/dL (ref 70–99)
Potassium: 4 mEq/L (ref 3.5–5.1)
Sodium: 136 mEq/L (ref 135–145)

## 2012-07-27 ENCOUNTER — Ambulatory Visit (HOSPITAL_COMMUNITY): Payer: BC Managed Care – PPO | Admitting: Certified Registered"

## 2012-07-27 ENCOUNTER — Encounter (HOSPITAL_COMMUNITY): Payer: Self-pay | Admitting: Certified Registered"

## 2012-07-27 ENCOUNTER — Ambulatory Visit (HOSPITAL_COMMUNITY)
Admission: RE | Admit: 2012-07-27 | Discharge: 2012-07-27 | Disposition: A | Discharge status: Home / Self Care | Payer: BC Managed Care – PPO | Source: Ambulatory Visit | Attending: Internal Medicine | Admitting: Internal Medicine

## 2012-07-27 ENCOUNTER — Encounter (HOSPITAL_COMMUNITY)
Admission: RE | Disposition: A | Discharge status: Home / Self Care | Payer: Self-pay | Source: Ambulatory Visit | Attending: Internal Medicine

## 2012-07-27 DIAGNOSIS — J45909 Unspecified asthma, uncomplicated: Secondary | ICD-10-CM | POA: Insufficient documentation

## 2012-07-27 DIAGNOSIS — Z888 Allergy status to other drugs, medicaments and biological substances status: Secondary | ICD-10-CM | POA: Insufficient documentation

## 2012-07-27 DIAGNOSIS — Z882 Allergy status to sulfonamides status: Secondary | ICD-10-CM | POA: Insufficient documentation

## 2012-07-27 DIAGNOSIS — M3500 Sicca syndrome, unspecified: Secondary | ICD-10-CM | POA: Insufficient documentation

## 2012-07-27 DIAGNOSIS — IMO0002 Reserved for concepts with insufficient information to code with codable children: Secondary | ICD-10-CM | POA: Insufficient documentation

## 2012-07-27 DIAGNOSIS — G43909 Migraine, unspecified, not intractable, without status migrainosus: Secondary | ICD-10-CM | POA: Insufficient documentation

## 2012-07-27 DIAGNOSIS — I498 Other specified cardiac arrhythmias: Secondary | ICD-10-CM | POA: Insufficient documentation

## 2012-07-27 DIAGNOSIS — Z8249 Family history of ischemic heart disease and other diseases of the circulatory system: Secondary | ICD-10-CM | POA: Insufficient documentation

## 2012-07-27 DIAGNOSIS — Z881 Allergy status to other antibiotic agents status: Secondary | ICD-10-CM | POA: Insufficient documentation

## 2012-07-27 DIAGNOSIS — J309 Allergic rhinitis, unspecified: Secondary | ICD-10-CM | POA: Insufficient documentation

## 2012-07-27 DIAGNOSIS — I471 Supraventricular tachycardia, unspecified: Secondary | ICD-10-CM | POA: Diagnosis present

## 2012-07-27 DIAGNOSIS — K219 Gastro-esophageal reflux disease without esophagitis: Secondary | ICD-10-CM | POA: Insufficient documentation

## 2012-07-27 DIAGNOSIS — Z79899 Other long term (current) drug therapy: Secondary | ICD-10-CM | POA: Insufficient documentation

## 2012-07-27 DIAGNOSIS — Z880 Allergy status to penicillin: Secondary | ICD-10-CM | POA: Insufficient documentation

## 2012-07-27 DIAGNOSIS — Z885 Allergy status to narcotic agent status: Secondary | ICD-10-CM | POA: Insufficient documentation

## 2012-07-27 HISTORY — PX: SUPRAVENTRICULAR TACHYCARDIA ABLATION: SHX5492

## 2012-07-27 HISTORY — PX: OTHER SURGICAL HISTORY: SHX169

## 2012-07-27 LAB — PREGNANCY, URINE: Preg Test, Ur: NEGATIVE

## 2012-07-27 SURGERY — SUPRAVENTRICULAR TACHYCARDIA ABLATION
Anesthesia: LOCAL

## 2012-07-27 MED ORDER — FENTANYL CITRATE 0.05 MG/ML IJ SOLN
INTRAMUSCULAR | Status: AC
  Filled 2012-07-27: qty 2

## 2012-07-27 MED ORDER — SODIUM CHLORIDE 0.9 % IV SOLN: 250.0000 mL | INTRAVENOUS | Status: DC | PRN

## 2012-07-27 MED ORDER — DILTIAZEM HCL 60 MG PO TABS: 60.0000 mg | ORAL_TABLET | Freq: Four times a day (QID) | ORAL | Status: DC | PRN

## 2012-07-27 MED ORDER — MIDAZOLAM HCL 5 MG/5ML IJ SOLN
INTRAMUSCULAR | Status: AC | PRN
  Administered 2012-07-27: 2 mg via INTRAVENOUS

## 2012-07-27 MED ORDER — PROPOFOL INFUSION 10 MG/ML OPTIME
INTRAVENOUS | Status: AC | PRN
  Administered 2012-07-27 (×2): 100 ug/kg/min via INTRAVENOUS

## 2012-07-27 MED ORDER — ONDANSETRON HCL 4 MG/2ML IJ SOLN: 4.0000 mg | Freq: Once | INTRAMUSCULAR | Status: DC | PRN

## 2012-07-27 MED ORDER — BUPIVACAINE HCL (PF) 0.25 % IJ SOLN
INTRAMUSCULAR | Status: AC
  Filled 2012-07-27: qty 30

## 2012-07-27 MED ORDER — ONDANSETRON HCL 4 MG/2ML IJ SOLN: 4.0000 mg | Freq: Four times a day (QID) | INTRAMUSCULAR | Status: DC | PRN

## 2012-07-27 MED ORDER — ACETAMINOPHEN 10 MG/ML IV SOLN
1000.0000 mg | Freq: Once | INTRAVENOUS | Status: DC | PRN
  Filled 2012-07-27: qty 100

## 2012-07-27 MED ORDER — FENTANYL CITRATE 0.05 MG/ML IJ SOLN
12.5000 ug | Freq: Once | INTRAMUSCULAR | Status: AC
  Administered 2012-07-27: 12.5 ug via INTRAVENOUS

## 2012-07-27 MED ORDER — SODIUM CHLORIDE 0.9 % IJ SOLN: 3.0000 mL | INTRAMUSCULAR | Status: DC | PRN

## 2012-07-27 MED ORDER — LIDOCAINE HCL (CARDIAC) 20 MG/ML IV SOLN
INTRAVENOUS | Status: AC | PRN
  Administered 2012-07-27: 100 mg via INTRAVENOUS

## 2012-07-27 MED ORDER — ACETAMINOPHEN 325 MG PO TABS
650.0000 mg | ORAL_TABLET | ORAL | Status: DC | PRN
  Filled 2012-07-27: qty 2

## 2012-07-27 MED ORDER — DEXTROSE 5 % IV SOLN
INTRAVENOUS | Status: AC
  Filled 2012-07-27: qty 250

## 2012-07-27 MED ORDER — METOPROLOL TARTRATE 12.5 MG HALF TABLET
12.5000 mg | ORAL_TABLET | Freq: Two times a day (BID) | ORAL | Status: DC
  Filled 2012-07-27: qty 1

## 2012-07-27 MED ORDER — SODIUM CHLORIDE 0.9 % IJ SOLN: 3.0000 mL | Freq: Two times a day (BID) | INTRAMUSCULAR | Status: DC

## 2012-07-27 MED ORDER — HYDROXYUREA 500 MG PO CAPS
ORAL_CAPSULE | ORAL | Status: AC
  Filled 2012-07-27: qty 1

## 2012-07-27 MED ORDER — FENTANYL CITRATE 0.05 MG/ML IJ SOLN: 25.0000 ug | INTRAMUSCULAR | Status: DC | PRN

## 2012-07-27 MED ORDER — FENTANYL CITRATE 0.05 MG/ML IJ SOLN
INTRAMUSCULAR | Status: AC | PRN
  Administered 2012-07-27: 25 ug via INTRAVENOUS
  Administered 2012-07-27: 100 ug via INTRAVENOUS
  Administered 2012-07-27: 50 ug via INTRAVENOUS
  Administered 2012-07-27: 25 ug via INTRAVENOUS
  Administered 2012-07-27: 50 ug via INTRAVENOUS

## 2012-07-27 MED ORDER — SODIUM CHLORIDE 0.9 % IV SOLN
INTRAVENOUS | Status: AC | PRN
  Administered 2012-07-27: 08:00:00 via INTRAVENOUS
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
## 2012-07-27 NOTE — Interval H&P Note (Signed)
History and Physical Interval Note:  07/27/2012 7:29 AM  Peggy Wallace  has presented today for surgery, with the diagnosis of SVT  The various methods of treatment have been discussed with the patient and family. After consideration of risks, benefits and other options for treatment, the patient has consented to  Procedure(s): SUPRAVENTRICULAR TACHYCARDIA ABLATION (N/A) as a surgical intervention .  The patient's history has been reviewed, patient examined, no change in status, stable for surgery.  I have reviewed the patient's chart and labs.  Questions were answered to the patient's satisfaction.     Hillis Range

## 2012-07-27 NOTE — Brief Op Note (Signed)
EP study today reveals nonsustained atrial tachycardia, not amenable to ablation

## 2012-07-27 NOTE — Anesthesia Preprocedure Evaluation (Addendum)
Anesthesia Evaluation  Patient identified by MRN, date of birth, ID band Patient awake    Reviewed: Allergy & Precautions, H&P , NPO status , Patient's Chart, lab work & pertinent test results, reviewed documented beta blocker date and time   Airway Mallampati: I TM Distance: >3 FB Neck ROM: full    Dental  (+) Teeth Intact and Dental Advidsory Given   Pulmonary asthma ,  breath sounds clear to auscultation        Cardiovascular + dysrhythmias Supra Ventricular Tachycardia Rhythm:Regular Rate:Normal     Neuro/Psych  Headaches,    GI/Hepatic GERD-  ,  Endo/Other    Renal/GU      Musculoskeletal   Abdominal   Peds  Hematology   Anesthesia Other Findings   Reproductive/Obstetrics                          Anesthesia Physical Anesthesia Plan  ASA: III  Anesthesia Plan: MAC   Post-op Pain Management:    Induction: Intravenous  Airway Management Planned: Natural Airway and Simple Face Mask  Additional Equipment:   Intra-op Plan:   Post-operative Plan:   Informed Consent: I have reviewed the patients History and Physical, chart, labs and discussed the procedure including the risks, benefits and alternatives for the proposed anesthesia with the patient or authorized representative who has indicated his/her understanding and acceptance.     Plan Discussed with: CRNA, Anesthesiologist and Surgeon  Anesthesia Plan Comments:         Anesthesia Quick Evaluation

## 2012-07-27 NOTE — Anesthesia Postprocedure Evaluation (Signed)
  Anesthesia Post-op Note  Patient: Peggy Wallace  Procedure(s) Performed: Procedure(s): SUPRAVENTRICULAR TACHYCARDIA ABLATION (N/A)  Patient Location: PACU  Anesthesia Type:MAC  Level of Consciousness: awake, alert  and oriented  Airway and Oxygen Therapy: Patient Spontanous Breathing and Patient connected to nasal cannula oxygen  Post-op Pain: mild  Post-op Assessment: Post-op Vital signs reviewed, Patient's Cardiovascular Status Stable, Respiratory Function Stable, Patent Airway and Pain level controlled  Post-op Vital Signs: stable  Complications: No apparent anesthesia complications

## 2012-07-27 NOTE — Preoperative (Signed)
Beta Blockers   Reason not to administer Beta Blockers:Not Applicable 

## 2012-07-28 NOTE — Op Note (Signed)
[name], [name] NO.:  [id number]  MEDICAL RECORD NO.:  [id number]  LOCATION:  [facility]                         FACILITY:  [facility]  PHYSICIAN:  Hillis Range, MD       DATE OF BIRTH:  September 19, 1965  DATE OF PROCEDURE:  07/27/2012 DATE OF DISCHARGE:  07/27/2012                              OPERATIVE REPORT   PREPROCEDURE DIAGNOSIS:  Supraventricular tachycardia.  POSTPROCEDURE DIAGNOSIS:  Ectopic atrial tachycardia.  PROCEDURES: 1. Comprehensive electrophysiologic study. 2. Coronary sinus pacing and recording. 3. Mapping of supraventricular tachycardia. 4. Arrhythmia induction with isoproterenol infusion.  INTRODUCTION:  [name] is a very pleasant 47 year old female with a history of symptomatic tachy palpitations.  She has failed medical therapy with metoprolol.  She recently had an event monitor placed which documented a narrow complex tachycardia at approximately 150 beats per minute.  She presents today for EP study and radiofrequency ablation.  DESCRIPTION OF PROCEDURE:  Informed written consent was obtained and the patient was brought to the electrophysiology lab in the fasting state. She was adequately sedated with intravenous medications as outlined in the Anesthesia report.  The patient's right neck and groin were prepped and draped in the usual sterile fashion by the EP lab staff.  Using a percutaneous Seldinger technique, one 6-French hemostasis sheath was placed into the right internal jugular vein.  A 6-French curved Damato catheter was introduced through the right internal jugular vein and advanced into the coronary sinus for recording and pacing from this location.  Two 6-French, one 8-French hemostasis sheaths were placed into the right common femoral vein.  Two 6-French quadripolar Josephson catheters were introduced through the right common femoral vein and advanced into the His bundle and right ventricular apex positions respectively.   The patient presented to the electrophysiology lab in normal sinus rhythm.  Her PR interval measured 134 milliseconds with a QRS duration of 94 milliseconds and a QT interval of 409 milliseconds. Her average RR interval measured 753 milliseconds.  Her AH interval measured 79 milliseconds and her HV interval measured 37 milliseconds. Ventricular pacing was performed which revealed midline concentric decremental VA conduction.  Retrograde VA Wenckebach cycle length was 570 milliseconds.  Ventricular extra stimulus testing revealed concentric decremental VA conduction with no retrograde jumps, echo beats, or tachycardias observed.  The retrograde ventricular ERP was 700/290 milliseconds.  No arrhythmias were observed.  Rapid atrial pacing was performed which revealed no evidence of PR greater than RR. The AV Wenckebach cycle length was 470 milliseconds.  Atrial extra stimulus testing was performed which revealed decremental AV conduction with an initial AH jump, but no echo beats and no tachycardias observed. Rapid atrial pacing was again performed down to a cycle length of 200 milliseconds with no arrhythmias observed.  Again, there was no evidence of PR greater than RR.  Isoproterenol was then infused at 2 mcg/minute. Rapid atrial pacing was performed which revealed no evidence of PR greater than RR and no arrhythmias observed when pacing down to a cycle length of 390 milliseconds.  Atrial extra stimulus testing was performed which revealed decremental AV conduction with no AH jumps, echo beats, or tachycardias observed.  This maneuver was repeated with a basic cycle  length of 500 milliseconds and also 450 milliseconds.  The atrial ERP was 500/210 milliseconds and 450/200 milliseconds.  Isuprel was decreased to 1 mcg/minute.  The patient had spontaneous tachycardia. This was a one-to-one tachycardia with a cycle length of 418 milliseconds.  The VA time measured 300 milliseconds with the  earliest atrial activation recorded from the distal coronary sinus, suggestive of probably left atrial tachycardia.  Ventricular pacing was attempted during tachycardia, however, the tachycardia terminated.  The patient was observed to have several other episodes of spontaneous atrial tachycardia which all spontaneously terminated.  These were too brief and the duration for mapping.  Ventricular pacing was again performed which revealed concentric decremental VA conduction with no retrograde jumps, echo beats, or tachycardias observed.  The ventricular ERP was 500/200 milliseconds.  Rapid atrial pacing was again performed multiple times in order to try to re-induce tachycardia, however, this was unsuccessful.  Isoproterenol was discontinued and allowed to wash out. During isoproterenol washout, atrial extra stimulus testing was performed multiple times.  This revealed no AH jumps, echo beats, or tachycardias observed.  Multiple attempts at rapid atrial pacing were again performed during isoproterenol washout, and again tachycardia could not be sustained.  Though the patient did appear to have a slow AV nodal pathway, this was a very poorly conducting pathway and tachycardia could not be observed.  The patient's clinical tachycardia appears to be ectopic atrial tachycardia.  Unfortunately, this tachycardia is not sustainable and therefore could not be mapped or ablated today.  The procedure was therefore considered completed.  All catheters were removed and the sheaths were aspirated and flushed.  The sheaths were removed and hemostasis was assured.  There were no early apparent complications.  CONCLUSIONS: 1. Sinus rhythm upon presentation. 2. No accessory pathways. 3. The patient had a poorly conducting slow AV nodal pathway, however,     this is felt to be a bystander. 4. The patient had inducible ectopic atrial tachycardia with a cycle     length of 430 milliseconds.  This  tachycardia, unfortunately,     however was not sustainable and therefore could not be mapped or     ablated today.  No ablation was therefore performed. 5. No early apparent complications.     Hillis Range, MD     JA/MEDQ  D:  07/27/2012  T:  07/28/2012  Job:  [id number]  cc:   Corky Crafts, MD

## 2012-08-31 ENCOUNTER — Encounter: Payer: Self-pay | Admitting: Internal Medicine

## 2012-08-31 ENCOUNTER — Ambulatory Visit (INDEPENDENT_AMBULATORY_CARE_PROVIDER_SITE_OTHER): Payer: BC Managed Care – PPO | Admitting: Internal Medicine

## 2012-08-31 VITALS — BP 100/60 | HR 66 | Ht 64.0 in | Wt 177.0 lb

## 2012-08-31 DIAGNOSIS — I471 Supraventricular tachycardia: Secondary | ICD-10-CM

## 2012-08-31 DIAGNOSIS — I498 Other specified cardiac arrhythmias: Secondary | ICD-10-CM
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
## 2012-08-31 NOTE — Patient Instructions (Signed)
We will see you as needed.  Please call with questions or concerns.

## 2013-10-01 ENCOUNTER — Encounter: Payer: Self-pay | Admitting: Gastroenterology

## 2014-01-04 ENCOUNTER — Encounter: Payer: Self-pay | Admitting: Gastroenterology

## 2014-03-20 ENCOUNTER — Encounter (HOSPITAL_COMMUNITY): Payer: Self-pay | Admitting: Internal Medicine

## 2014-08-05 ENCOUNTER — Other Ambulatory Visit: Payer: Self-pay | Admitting: Gastroenterology

## 2014-08-05 DIAGNOSIS — R1011 Right upper quadrant pain: Secondary | ICD-10-CM

## 2014-08-25 ENCOUNTER — Encounter (HOSPITAL_COMMUNITY)
Admission: RE | Admit: 2014-08-25 | Discharge: 2014-08-25 | Disposition: A | Discharge status: Home / Self Care | Payer: BLUE CROSS/BLUE SHIELD | Source: Ambulatory Visit | Attending: Gastroenterology | Admitting: Gastroenterology

## 2014-08-25 ENCOUNTER — Ambulatory Visit (HOSPITAL_COMMUNITY)
Admission: RE | Admit: 2014-08-25 | Discharge: 2014-08-25 | Disposition: A | Discharge status: Home / Self Care | Payer: BLUE CROSS/BLUE SHIELD | Source: Ambulatory Visit | Attending: Gastroenterology | Admitting: Gastroenterology

## 2014-08-25 ENCOUNTER — Encounter (HOSPITAL_COMMUNITY): Payer: Self-pay

## 2014-08-25 DIAGNOSIS — R1011 Right upper quadrant pain: Secondary | ICD-10-CM

## 2014-08-25 MED ORDER — SINCALIDE 5 MCG IJ SOLR
0.0200 ug/kg | Freq: Once | INTRAMUSCULAR | Status: AC
  Administered 2014-08-25: 1.8 ug via INTRAVENOUS

## 2014-08-25 MED ORDER — TECHNETIUM TC 99M MEBROFENIN IV KIT
5.4000 | PACK | Freq: Once | INTRAVENOUS | Status: AC | PRN
  Administered 2014-08-25: 5.4 via INTRAVENOUS

## 2014-10-03 ENCOUNTER — Ambulatory Visit (INDEPENDENT_AMBULATORY_CARE_PROVIDER_SITE_OTHER): Payer: BLUE CROSS/BLUE SHIELD | Admitting: Internal Medicine

## 2014-10-03 DIAGNOSIS — Z9189 Other specified personal risk factors, not elsewhere classified: Secondary | ICD-10-CM

## 2014-10-03 DIAGNOSIS — Z23 Encounter for immunization: Secondary | ICD-10-CM

## 2014-10-03 DIAGNOSIS — Z789 Other specified health status: Secondary | ICD-10-CM

## 2014-10-03 DIAGNOSIS — Z7189 Other specified counseling: Secondary | ICD-10-CM | POA: Diagnosis not present

## 2014-10-03 DIAGNOSIS — Z7184 Encounter for health counseling related to travel: Secondary | ICD-10-CM

## 2014-10-03 MED ORDER — AZITHROMYCIN 500 MG PO TABS: 500.0000 mg | ORAL_TABLET | Freq: Every day | ORAL | Status: DC

## 2014-10-03 MED ORDER — ATOVAQUONE-PROGUANIL HCL 250-100 MG PO TABS: 1.0000 | ORAL_TABLET | Freq: Every day | ORAL | Status: DC

## 2014-10-03 NOTE — Progress Notes (Signed)
Subjective:    Patient ID: Peggy Wallace, female    DOB: [birth year], 49 y.o.   MRN: [id number]  HPI 49yo F  who will be going to [place] and day trip to [place] with her boylfriend from sept 20 thru oct 3rd. She has traveled to [place] in the past where she had hep b series, one dose of hep a . Has not traveled to [place] before.   Allergies  Allergen Reactions  . Cephalosporins Hives and Itching    REACTION: rash  . Morphine And Related Hives and Itching  . Sulfonamide Derivatives     Hives itching   Current Outpatient Prescriptions on File Prior to Visit  Medication Sig Dispense Refill  . almotriptan (AXERT) 12.5 MG tablet Take 12.5 mg by mouth as needed for migraine. may repeat in 2 hours if needed    . budesonide-formoterol (SYMBICORT) 160-4.5 MCG/ACT inhaler Inhale 2 puffs into the lungs 2 (two) times daily as needed (shortness of breath).    . cetirizine (ZYRTEC) 10 MG tablet Take 10 mg by mouth daily.    . clonazePAM (KLONOPIN) 0.5 MG tablet Take 0.25-0.5 mg by mouth at bedtime as needed (sleep).     . diclofenac sodium (VOLTAREN) 1 % GEL Apply 1 g topically daily as needed (to foot for inflammation).    Marland Kitchen diltiazem (CARDIZEM) 60 MG tablet Take 1 tablet (60 mg total) by mouth every 6 (six) hours as needed. For heart racing 30 tablet 1  . hyoscyamine (LEVSIN SL) 0.125 MG SL tablet Place 0.125-0.25 mg under the tongue every 4 (four) hours as needed for cramping.     . levalbuterol (XOPENEX HFA) 45 MCG/ACT inhaler Inhale 1-2 puffs into the lungs daily as needed for shortness of breath.     . naproxen sodium (ANAPROX) 550 MG tablet Once daily for inflammation and pain as needed    . omeprazole-sodium bicarbonate (ZEGERID) 40-1100 MG per capsule Take 1 capsule by mouth 2 (two) times daily.      . valACYclovir (VALTREX) 1000 MG tablet Take 1 g by mouth daily as needed (outbreak).      Current Facility-Administered Medications on File Prior to Visit  Medication Dose Route  Frequency Provider Last Rate Last Dose  . 0.9 %  sodium chloride infusion    Continuous PRN Turner Daniels, CRNA      . fentaNYL (SUBLIMAZE) injection    PRN Turner Daniels, CRNA   50 mcg at 07/27/12 0926  . lidocaine (cardiac) 100 mg/44ml (XYLOCAINE) 20 MG/ML injection 2%    PRN Turner Daniels, CRNA   100 mg at 07/27/12 7124  . midazolam (VERSED) 5 MG/5ML injection    PRN Turner Daniels, CRNA   2 mg at 07/27/12 0800  . propofol (DIPRIVAN) infusion 10 mg/ml EMUL    Continuous PRN Turner Daniels, CRNA   Stopped at 07/27/12 0940   Active Ambulatory Problems    Diagnosis Date Noted  . HYPERLIPIDEMIA 04/29/2008  . SELECTIVE IGA IMMUNODEFICIENCY 04/25/2007  . ACUTE LARYNGITIS, WITHOUT MENTION OF OBSTRUCTIO 10/20/2009  . ALLERGIC RHINITIS 05/11/2006  . ASTHMA 05/11/2006  . GERD 07/24/2007  . IRRITABLE BOWEL SYNDROME 07/24/2007  . LOW BACK PAIN SYNDROME 06/18/2009  . OTHER DYSPHAGIA 04/29/2008  . MIGRAINES, HX OF 06/18/2009  . OTHER SPECIFIED DISEASE OF NAIL 04/09/2010  . HAIR LOSS 04/09/2010  . OTHER GENERAL SYMPTOMS 04/09/2010  . WEIGHT GAIN 06/21/2010  . Pharyngitis 03/21/2011  . SVT (supraventricular tachycardia) 07/05/2012  Resolved Ambulatory Problems    Diagnosis Date Noted  . No Resolved Ambulatory Problems   Past Medical History  Diagnosis Date  . Allergic rhinitis   . Asthma   . IgA deficiency   . Low back syndrome   . Migraines   . GERD (gastroesophageal reflux disease)   . Sjogren's syndrome   . Ectopic atrial tachycardia   - hx of c.difficile due to FQ exposure  Previous travel: [place], [place], [place] in the last 10 years  Review of Systems     Objective:   Physical Exam        Assessment & Plan:  Pre travel vaccination = will give typhoid injection, and hep A #2 today  Malaria proph = gave rx for malarone as well as advice on mosquito bite prevention  Traveler's diarrhea = recommended that she can use rifaxamin (her home med) if needed, nad also  gave rx for azithromycin if she did not respond after 3 days of treatment  Also recommend to get tdap thru her pcp next month

## 2014-10-21 ENCOUNTER — Other Ambulatory Visit: Payer: Self-pay | Admitting: Obstetrics and Gynecology

## 2014-10-24 ENCOUNTER — Encounter (HOSPITAL_COMMUNITY)
Admission: RE | Admit: 2014-10-24 | Discharge: 2014-10-24 | Disposition: A | Discharge status: Home / Self Care | Payer: BLUE CROSS/BLUE SHIELD | Source: Ambulatory Visit | Attending: Obstetrics and Gynecology | Admitting: Obstetrics and Gynecology

## 2014-10-24 ENCOUNTER — Encounter (HOSPITAL_COMMUNITY): Payer: Self-pay

## 2014-10-24 DIAGNOSIS — Z01818 Encounter for other preprocedural examination: Secondary | ICD-10-CM | POA: Diagnosis not present

## 2014-10-24 HISTORY — DX: Fibromyalgia: M79.7

## 2014-10-24 LAB — BASIC METABOLIC PANEL
Anion gap: 4 — ABNORMAL LOW (ref 5–15)
BUN: 13 mg/dL (ref 6–20)
CO2: 27 mmol/L (ref 22–32)
Calcium: 8.9 mg/dL (ref 8.9–10.3)
Chloride: 106 mmol/L (ref 101–111)
Creatinine, Ser: 0.86 mg/dL (ref 0.44–1.00)
GFR calc Af Amer: >60 mL/min (ref >60)
GFR calc non Af Amer: >60 mL/min (ref >60)
Glucose, Bld: 92 mg/dL (ref 65–99)
Potassium: 4 mmol/L (ref 3.5–5.1)
Sodium: 137 mmol/L (ref 135–145)

## 2014-10-24 LAB — CBC
HCT: 39.4 % (ref 36.0–46.0)
Hemoglobin: 13.2 g/dL (ref 12.0–15.0)
MCH: 29.1 pg (ref 26.0–34.0)
MCHC: 33.5 g/dL (ref 30.0–36.0)
MCV: 87 fL (ref 78.0–100.0)
Platelets: 230 10*3/uL (ref 150–400)
RBC: 4.53 MIL/uL (ref 3.87–5.11)
RDW: 13.4 % (ref 11.5–15.5)
WBC: 8.1 10*3/uL (ref 4.0–10.5)

## 2014-10-24 NOTE — Patient Instructions (Addendum)
Your procedure is scheduled on:  November 07, 2014   Enter through the Main Entrance of [facility] at:  11:30 am   Pick up the phone at the desk and dial [phone].  Call this number if you have problems the morning of surgery: [phone].  Remember: Do NOT eat food:  After midnight on November 06, 2014   Do NOT drink clear liquids after:  9:00 am  Take these medicines the morning of surgery with a SIP OF WATER:  Zegerid    Do NOT wear jewelry (body piercing), metal hair clips/bobby pins, make-up, or nail polish. Do NOT wear lotions, powders, or perfumes.  You may wear deoderant. Do NOT shave for 48 hours prior to surgery. Do NOT bring valuables to the hospital. Contacts, dentures, or bridgework may not be worn into surgery. Leave suitcase in car.  After surgery it may be brought to your room.  For patients admitted to the hospital, checkout time is 11:00 AM the day of discharge.

## 2014-11-05 ENCOUNTER — Other Ambulatory Visit (HOSPITAL_COMMUNITY)
Admission: AD | Admit: 2014-11-05 | Discharge: 2014-11-05 | Disposition: A | Discharge status: Home / Self Care | Payer: BLUE CROSS/BLUE SHIELD | Source: Ambulatory Visit | Attending: Obstetrics and Gynecology | Admitting: Obstetrics and Gynecology

## 2014-11-05 ENCOUNTER — Other Ambulatory Visit: Payer: Self-pay | Admitting: Obstetrics and Gynecology

## 2014-11-05 DIAGNOSIS — N736 Female pelvic peritoneal adhesions (postinfective): Secondary | ICD-10-CM | POA: Diagnosis not present

## 2014-11-05 DIAGNOSIS — N920 Excessive and frequent menstruation with regular cycle: Secondary | ICD-10-CM | POA: Diagnosis not present

## 2014-11-05 DIAGNOSIS — Z0183 Encounter for blood typing: Secondary | ICD-10-CM | POA: Diagnosis not present

## 2014-11-05 DIAGNOSIS — D802 Selective deficiency of immunoglobulin A [IgA]: Secondary | ICD-10-CM | POA: Diagnosis present

## 2014-11-05 DIAGNOSIS — N800 Endometriosis of uterus: Secondary | ICD-10-CM | POA: Diagnosis not present

## 2014-11-05 DIAGNOSIS — N838 Other noninflammatory disorders of ovary, fallopian tube and broad ligament: Secondary | ICD-10-CM | POA: Diagnosis not present

## 2014-11-06 LAB — ABO/RH: ABO/RH(D): O POS

## 2014-11-07 ENCOUNTER — Ambulatory Visit (HOSPITAL_COMMUNITY): Payer: BLUE CROSS/BLUE SHIELD | Admitting: Anesthesiology

## 2014-11-07 ENCOUNTER — Encounter (HOSPITAL_COMMUNITY): Payer: Self-pay | Admitting: Anesthesiology

## 2014-11-07 ENCOUNTER — Ambulatory Visit (HOSPITAL_COMMUNITY)
Admission: RE | Admit: 2014-11-07 | Discharge: 2014-11-07 | Disposition: A | Discharge status: Home / Self Care | Payer: BLUE CROSS/BLUE SHIELD | Source: Ambulatory Visit | Attending: Obstetrics and Gynecology | Admitting: Obstetrics and Gynecology

## 2014-11-07 ENCOUNTER — Encounter (HOSPITAL_COMMUNITY)
Admission: RE | Disposition: A | Discharge status: Home / Self Care | Payer: Self-pay | Source: Ambulatory Visit | Attending: Obstetrics and Gynecology

## 2014-11-07 DIAGNOSIS — N920 Excessive and frequent menstruation with regular cycle: Secondary | ICD-10-CM | POA: Diagnosis not present

## 2014-11-07 DIAGNOSIS — N736 Female pelvic peritoneal adhesions (postinfective): Secondary | ICD-10-CM | POA: Insufficient documentation

## 2014-11-07 DIAGNOSIS — N800 Endometriosis of uterus: Secondary | ICD-10-CM | POA: Insufficient documentation

## 2014-11-07 DIAGNOSIS — D802 Selective deficiency of immunoglobulin A [IgA]: Secondary | ICD-10-CM | POA: Insufficient documentation

## 2014-11-07 DIAGNOSIS — Z90710 Acquired absence of both cervix and uterus: Secondary | ICD-10-CM | POA: Diagnosis present

## 2014-11-07 DIAGNOSIS — N838 Other noninflammatory disorders of ovary, fallopian tube and broad ligament: Secondary | ICD-10-CM | POA: Insufficient documentation

## 2014-11-07 HISTORY — PX: LAPAROSCOPIC BILATERAL SALPINGECTOMY: SHX5889

## 2014-11-07 HISTORY — PX: ROBOTIC ASSISTED TOTAL HYSTERECTOMY: SHX6085

## 2014-11-07 LAB — CBC
HCT: 36.8 % (ref 36.0–46.0)
Hemoglobin: 12.1 g/dL (ref 12.0–15.0)
MCH: 28.8 pg (ref 26.0–34.0)
MCHC: 32.9 g/dL (ref 30.0–36.0)
MCV: 87.6 fL (ref 78.0–100.0)
Platelets: 210 10*3/uL (ref 150–400)
RBC: 4.2 MIL/uL (ref 3.87–5.11)
RDW: 13.2 % (ref 11.5–15.5)
WBC: 8.8 10*3/uL (ref 4.0–10.5)

## 2014-11-07 SURGERY — ROBOTIC ASSISTED TOTAL HYSTERECTOMY
Anesthesia: General | Site: Abdomen

## 2014-11-07 MED ORDER — ROCURONIUM BROMIDE 100 MG/10ML IV SOLN
INTRAVENOUS | Status: DC | PRN
  Administered 2014-11-07: 50 mg via INTRAVENOUS
  Administered 2014-11-07: 20 mg via INTRAVENOUS

## 2014-11-07 MED ORDER — SODIUM CHLORIDE 0.9 % IV SOLN
INTRAVENOUS | Status: DC | PRN
  Administered 2014-11-07: 60 mL

## 2014-11-07 MED ORDER — FENTANYL CITRATE (PF) 100 MCG/2ML IJ SOLN
INTRAMUSCULAR | Status: AC
  Filled 2014-11-07: qty 2

## 2014-11-07 MED ORDER — ONDANSETRON HCL 4 MG/2ML IJ SOLN
INTRAMUSCULAR | Status: AC
  Filled 2014-11-07: qty 2

## 2014-11-07 MED ORDER — GABAPENTIN 300 MG PO CAPS
300.0000 mg | ORAL_CAPSULE | Freq: Once | ORAL | Status: AC | PRN
  Administered 2014-11-07: 300 mg via ORAL

## 2014-11-07 MED ORDER — BUPIVACAINE HCL (PF) 0.25 % IJ SOLN
INTRAMUSCULAR | Status: AC
  Filled 2014-11-07: qty 30

## 2014-11-07 MED ORDER — LIDOCAINE HCL (CARDIAC) 20 MG/ML IV SOLN
INTRAVENOUS | Status: AC
  Filled 2014-11-07: qty 5

## 2014-11-07 MED ORDER — OXYCODONE-ACETAMINOPHEN 5-325 MG PO TABS
1.0000 | ORAL_TABLET | ORAL | Status: DC | PRN
  Administered 2014-11-07: 2 via ORAL
  Filled 2014-11-07: qty 2

## 2014-11-07 MED ORDER — DEXAMETHASONE SODIUM PHOSPHATE 4 MG/ML IJ SOLN
INTRAMUSCULAR | Status: AC
  Filled 2014-11-07: qty 1

## 2014-11-07 MED ORDER — FENTANYL CITRATE (PF) 250 MCG/5ML IJ SOLN
INTRAMUSCULAR | Status: AC
  Filled 2014-11-07: qty 5

## 2014-11-07 MED ORDER — ONDANSETRON HCL 4 MG/2ML IJ SOLN: 4.0000 mg | Freq: Four times a day (QID) | INTRAMUSCULAR | Status: DC | PRN

## 2014-11-07 MED ORDER — MEPERIDINE HCL 25 MG/ML IJ SOLN: 6.2500 mg | INTRAMUSCULAR | Status: DC | PRN

## 2014-11-07 MED ORDER — NEOSTIGMINE METHYLSULFATE 10 MG/10ML IV SOLN
INTRAVENOUS | Status: DC | PRN
  Administered 2014-11-07: 3 mg via INTRAVENOUS

## 2014-11-07 MED ORDER — CIPROFLOXACIN IN D5W 400 MG/200ML IV SOLN: 400.0000 mg | INTRAVENOUS | Status: DC

## 2014-11-07 MED ORDER — DEXTROSE 5 % IV SOLN
Freq: Once | INTRAVENOUS | Status: AC
  Administered 2014-11-07: 114.75 mL via INTRAVENOUS
  Filled 2014-11-07: qty 8.75

## 2014-11-07 MED ORDER — IBUPROFEN 800 MG PO TABS: 800.0000 mg | ORAL_TABLET | Freq: Three times a day (TID) | ORAL | Status: DC | PRN

## 2014-11-07 MED ORDER — ONDANSETRON HCL 4 MG/2ML IJ SOLN: 4.0000 mg | Freq: Once | INTRAMUSCULAR | Status: DC | PRN

## 2014-11-07 MED ORDER — PROPOFOL 10 MG/ML IV BOLUS
INTRAVENOUS | Status: DC | PRN
  Administered 2014-11-07: 200 mg via INTRAVENOUS

## 2014-11-07 MED ORDER — SCOPOLAMINE 1 MG/3DAYS TD PT72
MEDICATED_PATCH | TRANSDERMAL | Status: AC
  Filled 2014-11-07: qty 1

## 2014-11-07 MED ORDER — ACETAMINOPHEN 10 MG/ML IV SOLN
1000.0000 mg | Freq: Once | INTRAVENOUS | Status: AC
  Administered 2014-11-07: 1000 mg via INTRAVENOUS
  Filled 2014-11-07: qty 100

## 2014-11-07 MED ORDER — CLINDAMYCIN PHOSPHATE 900 MG/50ML IV SOLN: 900.0000 mg | INTRAVENOUS | Status: DC

## 2014-11-07 MED ORDER — METOCLOPRAMIDE HCL 5 MG/ML IJ SOLN
INTRAMUSCULAR | Status: AC
  Filled 2014-11-07: qty 2

## 2014-11-07 MED ORDER — OXYCODONE-ACETAMINOPHEN 5-325 MG PO TABS: 1.0000 | ORAL_TABLET | ORAL | Status: DC | PRN

## 2014-11-07 MED ORDER — PANTOPRAZOLE SODIUM 40 MG PO TBEC: 40.0000 mg | DELAYED_RELEASE_TABLET | Freq: Every day | ORAL | Status: DC

## 2014-11-07 MED ORDER — SODIUM CHLORIDE 0.9 % IJ SOLN
INTRAMUSCULAR | Status: AC
  Filled 2014-11-07: qty 50

## 2014-11-07 MED ORDER — PROPOFOL 10 MG/ML IV BOLUS
INTRAVENOUS | Status: AC
  Filled 2014-11-07: qty 20

## 2014-11-07 MED ORDER — KETOROLAC TROMETHAMINE 30 MG/ML IJ SOLN
INTRAMUSCULAR | Status: AC
  Filled 2014-11-07: qty 1

## 2014-11-07 MED ORDER — MIDAZOLAM HCL 2 MG/2ML IJ SOLN
INTRAMUSCULAR | Status: AC
  Filled 2014-11-07: qty 2

## 2014-11-07 MED ORDER — GABAPENTIN 300 MG PO CAPS
ORAL_CAPSULE | ORAL | Status: AC
  Filled 2014-11-07: qty 1

## 2014-11-07 MED ORDER — SIMETHICONE 80 MG PO CHEW: 80.0000 mg | CHEWABLE_TABLET | Freq: Four times a day (QID) | ORAL | Status: DC | PRN

## 2014-11-07 MED ORDER — LACTATED RINGERS IV SOLN
INTRAVENOUS | Status: DC
  Administered 2014-11-07: 10:00:00 via INTRAVENOUS

## 2014-11-07 MED ORDER — KETOROLAC TROMETHAMINE 30 MG/ML IJ SOLN: 30.0000 mg | Freq: Once | INTRAMUSCULAR | Status: DC | PRN

## 2014-11-07 MED ORDER — ACETAMINOPHEN 500 MG PO TABS: 1000.0000 mg | ORAL_TABLET | Freq: Once | ORAL | Status: DC

## 2014-11-07 MED ORDER — LIDOCAINE HCL (CARDIAC) 20 MG/ML IV SOLN
INTRAVENOUS | Status: DC | PRN
  Administered 2014-11-07: 100 mg via INTRAVENOUS

## 2014-11-07 MED ORDER — ONDANSETRON HCL 4 MG/2ML IJ SOLN
INTRAMUSCULAR | Status: DC | PRN
  Administered 2014-11-07: 4 mg via INTRAVENOUS

## 2014-11-07 MED ORDER — ROPIVACAINE HCL 5 MG/ML IJ SOLN
INTRAMUSCULAR | Status: AC
  Filled 2014-11-07: qty 30

## 2014-11-07 MED ORDER — LACTATED RINGERS IR SOLN
Status: DC | PRN
Start: 2014-11-07 — End: 2014-11-07
  Administered 2014-11-07: 3000 mL

## 2014-11-07 MED ORDER — ROCURONIUM BROMIDE 100 MG/10ML IV SOLN
INTRAVENOUS | Status: AC
  Filled 2014-11-07: qty 1

## 2014-11-07 MED ORDER — GABAPENTIN 400 MG PO CAPS
ORAL_CAPSULE | ORAL | Status: AC
  Filled 2014-11-07: qty 1

## 2014-11-07 MED ORDER — MIDAZOLAM HCL 2 MG/2ML IJ SOLN
INTRAMUSCULAR | Status: DC | PRN
Start: 2014-11-07 — End: 2014-11-07
  Administered 2014-11-07: 2 mg via INTRAVENOUS

## 2014-11-07 MED ORDER — IBUPROFEN 800 MG PO TABS
800.0000 mg | ORAL_TABLET | Freq: Three times a day (TID) | ORAL | Status: DC | PRN
  Administered 2014-11-07: 800 mg via ORAL
  Filled 2014-11-07: qty 1

## 2014-11-07 MED ORDER — KETOROLAC TROMETHAMINE 30 MG/ML IJ SOLN: 30.0000 mg | Freq: Four times a day (QID) | INTRAMUSCULAR | Status: DC

## 2014-11-07 MED ORDER — SCOPOLAMINE 1 MG/3DAYS TD PT72: 1.0000 | MEDICATED_PATCH | Freq: Once | TRANSDERMAL | Status: DC

## 2014-11-07 MED ORDER — HYDROMORPHONE HCL 1 MG/ML IJ SOLN
0.2000 mg | INTRAMUSCULAR | Status: DC | PRN
  Administered 2014-11-07: 0.5 mg via INTRAVENOUS
  Filled 2014-11-07: qty 1

## 2014-11-07 MED ORDER — FENTANYL CITRATE (PF) 100 MCG/2ML IJ SOLN
INTRAMUSCULAR | Status: DC | PRN
  Administered 2014-11-07 (×3): 100 ug via INTRAVENOUS
  Administered 2014-11-07 (×3): 50 ug via INTRAVENOUS

## 2014-11-07 MED ORDER — ACETAMINOPHEN 160 MG/5ML PO SOLN: 960.0000 mg | Freq: Once | ORAL | Status: DC

## 2014-11-07 MED ORDER — GLYCOPYRROLATE 0.2 MG/ML IJ SOLN
INTRAMUSCULAR | Status: DC | PRN
  Administered 2014-11-07: 0.4 mg via INTRAVENOUS

## 2014-11-07 MED ORDER — ONDANSETRON HCL 4 MG PO TABS: 4.0000 mg | ORAL_TABLET | Freq: Four times a day (QID) | ORAL | Status: DC | PRN

## 2014-11-07 MED ORDER — FENTANYL CITRATE (PF) 100 MCG/2ML IJ SOLN
25.0000 ug | INTRAMUSCULAR | Status: DC | PRN
  Administered 2014-11-07: 50 ug via INTRAVENOUS

## 2014-11-07 MED ORDER — METOCLOPRAMIDE HCL 5 MG/ML IJ SOLN
INTRAMUSCULAR | Status: DC | PRN
  Administered 2014-11-07 (×2): 5 mg via INTRAVENOUS

## 2014-11-07 MED ORDER — DEXAMETHASONE SODIUM PHOSPHATE 10 MG/ML IJ SOLN
INTRAMUSCULAR | Status: DC | PRN
Start: 2014-11-07 — End: 2014-11-07
  Administered 2014-11-07: 4 mg via INTRAVENOUS

## 2014-11-07 MED ORDER — BUPIVACAINE HCL (PF) 0.25 % IJ SOLN
INTRAMUSCULAR | Status: DC | PRN
  Administered 2014-11-07: 9 mL

## 2014-11-07 MED ORDER — MENTHOL 3 MG MT LOZG
1.0000 | LOZENGE | OROMUCOSAL | Status: DC | PRN
  Administered 2014-11-07: 3 mg via ORAL
  Filled 2014-11-07: qty 9

## 2014-11-07 MED ORDER — DEXTROSE IN LACTATED RINGERS 5 % IV SOLN
INTRAVENOUS | Status: DC
  Administered 2014-11-07 (×2): via INTRAVENOUS

## 2014-11-07 SURGICAL SUPPLY — 73 items
APPLICATOR COTTON TIP 6IN STRL (MISCELLANEOUS) ×3 IMPLANT
BARRIER ADHS 3X4 INTERCEED (GAUZE/BANDAGES/DRESSINGS) ×3 IMPLANT
CABLE HIGH FREQUENCY MONO STRZ (ELECTRODE) IMPLANT
CATH FOLEY 3WAY  5CC 16FR (CATHETERS) ×1
CATH FOLEY 3WAY 5CC 16FR (CATHETERS) ×2 IMPLANT
CATH ROBINSON RED A/P 16FR (CATHETERS) ×3 IMPLANT
CLOTH BEACON ORANGE TIMEOUT ST (SAFETY) ×3 IMPLANT
CONT PATH 16OZ SNAP LID 3702 (MISCELLANEOUS) ×3 IMPLANT
COVER BACK TABLE 60X90IN (DRAPES) ×6 IMPLANT
COVER TIP SHEARS 8 DVNC (MISCELLANEOUS) ×2 IMPLANT
COVER TIP SHEARS 8MM DA VINCI (MISCELLANEOUS) ×1
DECANTER SPIKE VIAL GLASS SM (MISCELLANEOUS) ×3 IMPLANT
DEVICE TROCAR PUNCTURE CLOSURE (ENDOMECHANICALS) IMPLANT
DRAPE WARM FLUID 44X44 (DRAPE) ×3 IMPLANT
DRSG COVADERM PLUS 2X2 (GAUZE/BANDAGES/DRESSINGS) IMPLANT
DRSG OPSITE POSTOP 3X4 (GAUZE/BANDAGES/DRESSINGS) ×3 IMPLANT
DURAPREP 26ML APPLICATOR (WOUND CARE) ×3 IMPLANT
ELECT LIGASURE LONG (ELECTRODE) IMPLANT
ELECT REM PT RETURN 9FT ADLT (ELECTROSURGICAL) ×3
ELECTRODE REM PT RTRN 9FT ADLT (ELECTROSURGICAL) ×2 IMPLANT
EVACUATOR SMOKE 8.L (FILTER) ×3 IMPLANT
FORCEPS CUTTING 33CM 5MM (CUTTING FORCEPS) IMPLANT
FORCEPS CUTTING 45CM 5MM (CUTTING FORCEPS) IMPLANT
GAUZE VASELINE 3X9 (GAUZE/BANDAGES/DRESSINGS) IMPLANT
GLOVE BIOGEL PI IND STRL 7.0 (GLOVE) ×8 IMPLANT
GLOVE BIOGEL PI INDICATOR 7.0 (GLOVE) ×4
GLOVE ECLIPSE 6.5 STRL STRAW (GLOVE) ×3 IMPLANT
GOWN STRL REUS W/TWL LRG LVL3 (GOWN DISPOSABLE) ×9 IMPLANT
KIT ACCESSORY DA VINCI DISP (KITS) ×1
KIT ACCESSORY DVNC DISP (KITS) ×2 IMPLANT
LEGGING LITHOTOMY PAIR STRL (DRAPES) ×3 IMPLANT
LIQUID BAND (GAUZE/BANDAGES/DRESSINGS) ×3 IMPLANT
NEEDLE INSUFFLATION 120MM (ENDOMECHANICALS) ×3 IMPLANT
NEEDLE INSUFFLATION 150MM (ENDOMECHANICALS) ×3 IMPLANT
NEEDLE SPNL 22GX7 QUINCKE BK (NEEDLE) IMPLANT
NS IRRIG 1000ML POUR BTL (IV SOLUTION) ×3 IMPLANT
OCCLUDER COLPOPNEUMO (BALLOONS) ×3 IMPLANT
PACK LAPAROSCOPY BASIN (CUSTOM PROCEDURE TRAY) ×3 IMPLANT
PACK ROBOT WH (CUSTOM PROCEDURE TRAY) ×3 IMPLANT
PACK ROBOTIC GOWN (GOWN DISPOSABLE) ×3 IMPLANT
PAD POSITIONER PINK NONSTERILE (MISCELLANEOUS) ×3 IMPLANT
PAD PREP 24X48 CUFFED NSTRL (MISCELLANEOUS) ×6 IMPLANT
POUCH SPECIMEN RETRIEVAL 10MM (ENDOMECHANICALS) IMPLANT
PROTECTOR NERVE ULNAR (MISCELLANEOUS) ×3 IMPLANT
SCISSORS LAP 5X35 DISP (ENDOMECHANICALS) IMPLANT
SET CYSTO W/LG BORE CLAMP LF (SET/KITS/TRAYS/PACK) IMPLANT
SET IRRIG TUBING LAPAROSCOPIC (IRRIGATION / IRRIGATOR) ×3 IMPLANT
SET TRI-LUMEN FLTR TB AIRSEAL (TUBING) ×3 IMPLANT
SOLUTION ELECTROLUBE (MISCELLANEOUS) IMPLANT
SUT VIC AB 0 CT1 36 (SUTURE) ×6 IMPLANT
SUT VICRYL 0 UR6 27IN ABS (SUTURE) ×3 IMPLANT
SUT VICRYL 4-0 PS2 18IN ABS (SUTURE) ×6 IMPLANT
SUT VLOC 180 0 9IN  GS21 (SUTURE) ×1
SUT VLOC 180 0 9IN GS21 (SUTURE) ×2 IMPLANT
SYR 50ML LL SCALE MARK (SYRINGE) ×3 IMPLANT
SYRINGE 10CC LL (SYRINGE) ×3 IMPLANT
TIP RUMI ORANGE 6.7MMX12CM (TIP) IMPLANT
TIP UTERINE 5.1X6CM LAV DISP (MISCELLANEOUS) IMPLANT
TIP UTERINE 6.7X10CM GRN DISP (MISCELLANEOUS) IMPLANT
TIP UTERINE 6.7X6CM WHT DISP (MISCELLANEOUS) IMPLANT
TIP UTERINE 6.7X8CM BLUE DISP (MISCELLANEOUS) ×3 IMPLANT
TOWEL OR 17X24 6PK STRL BLUE (TOWEL DISPOSABLE) ×9 IMPLANT
TROCAR BALLN 12MMX100 BLUNT (TROCAR) IMPLANT
TROCAR DISP BLADELESS 8 DVNC (TROCAR) IMPLANT
TROCAR DISP BLADELESS 8MM (TROCAR)
TROCAR OPTI TIP 5M 100M (ENDOMECHANICALS) ×6 IMPLANT
TROCAR PORT AIRSEAL 5X120 (TROCAR) ×3 IMPLANT
TROCAR XCEL 12X100 BLDLESS (ENDOMECHANICALS) ×3 IMPLANT
TROCAR XCEL DIL TIP R 11M (ENDOMECHANICALS) ×3 IMPLANT
TROCAR XCEL NON-BLD 5MMX100MML (ENDOMECHANICALS) ×6 IMPLANT
TROCAR Z-THREAD 12X150 (TROCAR) ×3 IMPLANT
WARMER LAPAROSCOPE (MISCELLANEOUS) ×3 IMPLANT
WATER STERILE IRR 1000ML POUR (IV SOLUTION) ×9 IMPLANT

## 2014-11-07 NOTE — OR Nursing (Signed)
Fentanyl given at 14:25 for pain score of 3-4. Margarita Mail rn

## 2014-11-07 NOTE — Progress Notes (Signed)
Pt A/O x4. Pain managed, no nausea/ vomiting.Vital signs WNL.Instructed patient to attempt to void again in 4hrs and if no void in 6 hours call office or return to MAU as instructed by Dr. [name] Pina.Discharge instructions reviewed, prescriptions given to patient, questions answered. Pt tolerating PO meds and fluids.  Pt assisted to car via wheelchair accompanied by nursing staff.

## 2014-11-07 NOTE — Transfer of Care (Signed)
Immediate Anesthesia Transfer of Care Note  Patient: Peggy Wallace  Procedure(s) Performed: Procedure(s): ROBOTIC ASSISTED TOTAL HYSTERECTOMY (N/A) LAPAROSCOPIC BILATERAL SALPINGECTOMY (Bilateral)  Patient Location: PACU  Anesthesia Type:General  Level of Consciousness: awake, alert  and oriented  Airway & Oxygen Therapy: Patient Spontanous Breathing and Patient connected to nasal cannula oxygen  Post-op Assessment: Report given to RN and Post -op Vital signs reviewed and stable  Post vital signs: Reviewed and stable  Last Vitals:  Filed Vitals:   11/07/14 0913  BP: 114/76  Pulse: 80  Temp: 36.8 C  Resp: 18    Complications: No apparent anesthesia complications

## 2014-11-07 NOTE — Anesthesia Procedure Notes (Signed)
Procedure Name: Intubation Date/Time: 11/07/2014 10:42 AM Performed by: Shanon Payor Pre-anesthesia Checklist: Patient identified, Emergency Drugs available, Suction available, Patient being monitored and Timeout performed Patient Re-evaluated:Patient Re-evaluated prior to inductionOxygen Delivery Method: Circle system utilized Preoxygenation: Pre-oxygenation with 100% oxygen Intubation Type: IV induction Ventilation: Mask ventilation without difficulty Laryngoscope Size: Mac and 3 Grade View: Grade I Tube type: Oral Tube size: 7.0 mm Number of attempts: 1 Airway Equipment and Method: Stylet Secured at: 21 cm Tube secured with: Tape Dental Injury: Teeth and Oropharynx as per pre-operative assessment

## 2014-11-07 NOTE — Brief Op Note (Signed)
11/07/2014  1:38 PM  PATIENT:  Peggy Wallace  49 y.o. female  PRE-OPERATIVE DIAGNOSIS:  Menorrhagia, Essure, IGA deficiency, previous cesarean section  POST-OPERATIVE DIAGNOSIS:  Menorrhagia, essure, IGA deficiency, previous cesarean section  PROCEDURE:  Da vinci robotic total hysterectomy, bilateral salpingectomy  SURGEON:  Surgeon(s) and Role:    * Maxie Better, MD - Primary  PHYSICIAN ASSISTANT:   ASSISTANTS: Raelyn Mora, CNM   ANESTHESIA:   general Findings: nl liver edge, nl tubes, ovaries, sl enlarged uterus, bladder peritoneal scar EBL:  Total I/O In: 1200 [I.V.:1200] Out: 175 [Urine:125; Blood:50]  BLOOD ADMINISTERED:none  DRAINS: none   LOCAL MEDICATIONS USED:  MARCAINE     SPECIMEN:  Source of Specimen:  uterus with cervix, tubes with essure  DISPOSITION OF SPECIMEN:  PATHOLOGY  COUNTS:  YES  TOURNIQUET:  * No tourniquets in log *  DICTATION: .Other Dictation: Dictation Number [id number]  PLAN OF CARE: Discharge to home after PACU   PATIENT DISPOSITION:  PACU - hemodynamically stable.   Delay start of Pharmacological VTE agent (>24hrs) due to surgical blood loss or risk of bleeding: no

## 2014-11-07 NOTE — Anesthesia Postprocedure Evaluation (Signed)
  Anesthesia Post-op Note  Patient: Peggy Wallace  Procedure(s) Performed: Procedure(s): ROBOTIC ASSISTED TOTAL HYSTERECTOMY (N/A) LAPAROSCOPIC BILATERAL SALPINGECTOMY (Bilateral)  Patient Location: PACU  Anesthesia Type:General  Level of Consciousness: awake and alert   Airway and Oxygen Therapy: Patient Spontanous Breathing and Patient connected to nasal cannula oxygen  Post-op Pain: mild  Post-op Assessment: Post-op Vital signs reviewed, Patient's Cardiovascular Status Stable, Respiratory Function Stable, Patent Airway and No signs of Nausea or vomiting              Post-op Vital Signs: Reviewed and stable  Last Vitals:  Filed Vitals:   11/07/14 0913  BP: 114/76  Pulse: 80  Temp: 36.8 C  Resp: 18    Complications: No apparent anesthesia complications

## 2014-11-07 NOTE — Anesthesia Preprocedure Evaluation (Signed)
Anesthesia Evaluation  Patient identified by MRN, date of birth, ID band Patient awake    Reviewed: Allergy & Precautions, H&P , NPO status , Patient's Chart, lab work & pertinent test results  Airway Mallampati: I  TM Distance: >3 FB Neck ROM: full    Dental no notable dental hx. (+) Teeth Intact   Pulmonary    Pulmonary exam normal       Cardiovascular negative cardio ROS Normal cardiovascular exam    Neuro/Psych negative psych ROS   GI/Hepatic Neg liver ROS, GERD-  Medicated and Controlled,  Endo/Other  negative endocrine ROS  Renal/GU negative Renal ROS     Musculoskeletal   Abdominal Normal abdominal exam  (+)   Peds  Hematology negative hematology ROS (+)   Anesthesia Other Findings   Reproductive/Obstetrics negative OB ROS                             Anesthesia Physical Anesthesia Plan  ASA: II  Anesthesia Plan: General   Post-op Pain Management:    Induction: Intravenous  Airway Management Planned: Oral ETT  Additional Equipment:   Intra-op Plan:   Post-operative Plan: Extubation in OR  Informed Consent: I have reviewed the patients History and Physical, chart, labs and discussed the procedure including the risks, benefits and alternatives for the proposed anesthesia with the patient or authorized representative who has indicated his/her understanding and acceptance.   Dental Advisory Given  Plan Discussed with: CRNA and Surgeon  Anesthesia Plan Comments:         Anesthesia Quick Evaluation

## 2014-11-07 NOTE — Progress Notes (Signed)
Dr. [name] Pina notified at this time. Pt urine output following discontinued foley cath, 77ml. Bladder scan post void . Intake adequate. Patient can still discharge to home. Instruct patient to attempt to void in 4hrs and if no void in 6hrs return to MAU or call [facility].

## 2014-11-08 LAB — TYPE AND SCREEN
ABO/RH(D): O POS
Antibody Screen: NEGATIVE
Unit division: 0
Unit division: 0

## 2014-11-10 ENCOUNTER — Encounter (HOSPITAL_COMMUNITY): Payer: Self-pay | Admitting: Obstetrics and Gynecology

## 2014-11-10 NOTE — Op Note (Signed)
NAMERIANE, RUNG NO.:  [id number]  MEDICAL RECORD NO.:  [id number]  LOCATION:  9318                          FACILITY:  [facility]  PHYSICIAN:  Maxie Better, M.D.DATE OF BIRTH:  1966/02/22  DATE OF PROCEDURE:  11/07/2014 DATE OF DISCHARGE:  11/07/2014                              OPERATIVE REPORT   PREOPERATIVE DIAGNOSIS:  Menorrhagia, history of Essure sterilization, previous cesarean section.  PROCEDURE:  Da Vinci robotic total hysterectomy, bilateral salpingectomy.  POSTOPERATIVE DIAGNOSIS:  Menorrhagia, history of Essure sterilization, previous cesarean section.  ANESTHESIA:  General.  SURGEON:  Maxie Better, MD  ASSISTANT:  Raelyn Mora, CNM  DESCRIPTION OF PROCEDURE:  Under adequate general anesthesia, the patient was placed in the dorsal lithotomy position.  She was positioned for robotic surgery.  She was sterilely prepped and draped in usual fashion.  A 3-way Foley catheter was sterilely placed.  A weighted speculum was placed in vagina.  Sims retractor was placed anteriorly. Examination under anesthesia had revealed anteverted uterus.  No adnexal masses could be appreciated.  A 0 Vicryl figure-of-eight suture was placed on the anterior and on the posterior lip of the cervix.  The uterus sounded to 8 cm.  The cervix was then serially dilated and a #8 uterine manipulator with a medium-sized RUMI cup was introduced into the uterine cavity without incident.  The retractors were then removed. Attention was then turned to the abdomen.  A 0.25% Marcaine was injected to infraumbilical scar incision.  Infraumbilical site was then incised and Veress needle was introduced and tested with sterile water.  Veress needle was then introduced, had CO2 insufflated, an amount of 2.5 L. Veress needle was then removed.  A 12 mm disposable trocar with sleeve was introduced into the abdomen without incident.  A lighted video laparoscope was then placed  through that port.  The robotic camera was then introduced.  Sub-panoramic inspection was then noted.  Normal liver edge was seen.  The patient was placed in Trendelenburg position. Uterus appeared slightly enlarged, but otherwise unremarkable.  Prior evidence of cesarean section in the bladder area was noted.  Normal tubes and ovaries were noted bilaterally.  Both ureters were seen peristalsing deep in the pelvis.  Two robotic ports that were placed on either side and #5 AirSeal assistant port was then placed in the right lower quadrant.  The robotic ports were placed under direct visualization.  The robot was docked to the patient's left side; in arm #1, monopolar scissors; arm #2, the PK dissector.  The procedure was started on the left side.  The adhesions of the bowel to the left pelvic sidewall were lysed.  The fallopian tube on the left.  The underlying mesosalpinx was identified and serially clamped, cauterized, and then cut.  The retroperitoneal space was opened proximally.  The round ligament was then serially clamped, cauterized, and cut.  The posterior leaf of the broad ligament was opened and the left utero-ovarian ligament was serially clamped, cauterized, and then cut.  At that point, the anterior leaf of the broad ligament was carried anteriorly and the bladder was sharply dissected off the lower uterine segment and overlying the RUMI cup.  Uterine vessels were then skeletonized,  serially clamped, cauterized, but not cut.  Attention was then turned to the opposite side where a similar procedure was then performed starting with the fallopian tube as well after identifying again the ureter.  The uterine vessels were once again skeletonized, clamped, cauterized, serially cut after the bladder was further dissected, not inferiorly. At that point, the vaginal insufflator was placed and the cervicovaginal junction at the proximal portion of the RUMI cup was identified  and circumferentially cut, resulted in the uterus being attached from its vaginal attachment.  It was then removed through the vagina.  The insufflator was then reinserted.  The instruments were then replaced by the Mega Suture driver and the long-tipped forceps in arm #1 and #2 respectively.  The 0 V-Loc sutures were then placed through the umbilical port site.  The vaginal cuff was then closed with 2-0 V-Loc running lock stitch and oversewn with the V-Loc sutures.  Good hemostasis was noted.  The PK was then replaced with long forceps clamp to cauterize the right ovary pedicle.  Abdomen was irrigated and suctioned.  Good hemostasis was noted.  Procedure was then terminated by removing the robotic instruments and docking the robot.  At that point, the suture needles were then removed.  I then went back to the patient's bedside and after confirming good hemostasis, and the vaginal cuff being palpated through the case and well-approximated.  The robotic instruments were then removed.  The abdomen was deflated with the AirSeal been removed last and incisions and sites were then closed with 4-0 Vicryl subcuticular stitch, 0 Vicryl for the rectus fascia.  I again re-examined the vaginal cuff.  Good hemostasis was noted and good approximated was felt.  SPECIMENS:  Uterus with fallopian tubes and cervix sent to Pathology.  ESTIMATED BLOOD LOSS:  50 ml.  INTRAOPERATIVE FLUIDS:  1500 ml.  URINE OUTPUT:  150 mL clear orange-tinged urine from the Pyridium.  COMPLICATION:  None.  The patient tolerated the procedure well, was transferred to recovery in stable condition.     Maxie Better, M.D.     [address]/MEDQ  D:  11/07/2014  T:  11/08/2014  Job:  [id number]

## 2015-01-16 ENCOUNTER — Other Ambulatory Visit: Payer: Self-pay | Admitting: *Deleted

## 2015-01-16 MED ORDER — OMEPRAZOLE 40 MG PO CPDR: 40.0000 mg | DELAYED_RELEASE_CAPSULE | Freq: Two times a day (BID) | ORAL | Status: DC

## 2015-02-06 ENCOUNTER — Other Ambulatory Visit: Payer: Self-pay | Admitting: Radiology

## 2015-02-06 DIAGNOSIS — N6310 Unspecified lump in the right breast, unspecified quadrant: Secondary | ICD-10-CM

## 2015-02-18 ENCOUNTER — Ambulatory Visit
Admission: RE | Admit: 2015-02-18 | Discharge: 2015-02-18 | Disposition: A | Discharge status: Home / Self Care | Payer: BLUE CROSS/BLUE SHIELD | Source: Ambulatory Visit | Attending: Radiology | Admitting: Radiology

## 2015-02-18 DIAGNOSIS — N6310 Unspecified lump in the right breast, unspecified quadrant: Secondary | ICD-10-CM

## 2015-02-18 MED ORDER — GADOBENATE DIMEGLUMINE 529 MG/ML IV SOLN: 18.0000 mL | Freq: Once | INTRAVENOUS | Status: DC | PRN

## 2015-02-18 MED ORDER — GADOBENATE DIMEGLUMINE 529 MG/ML IV SOLN
18.0000 mL | Freq: Once | INTRAVENOUS | Status: AC | PRN
  Administered 2015-02-18: 18 mL via INTRAVENOUS

## 2015-04-12 DIAGNOSIS — M84375A Stress fracture, left foot, initial encounter for fracture: Secondary | ICD-10-CM

## 2015-04-12 HISTORY — DX: Stress fracture, left foot, initial encounter for fracture: M84.375A

## 2015-05-04 ENCOUNTER — Encounter: Payer: Self-pay | Admitting: Allergy and Immunology

## 2015-05-04 ENCOUNTER — Ambulatory Visit (INDEPENDENT_AMBULATORY_CARE_PROVIDER_SITE_OTHER): Payer: BLUE CROSS/BLUE SHIELD | Admitting: Allergy and Immunology

## 2015-05-04 VITALS — BP 118/70 | HR 72 | Temp 97.8°F | Resp 18

## 2015-05-04 DIAGNOSIS — J0110 Acute frontal sinusitis, unspecified: Secondary | ICD-10-CM | POA: Insufficient documentation

## 2015-05-04 DIAGNOSIS — J3089 Other allergic rhinitis: Secondary | ICD-10-CM | POA: Diagnosis not present

## 2015-05-04 DIAGNOSIS — K219 Gastro-esophageal reflux disease without esophagitis: Secondary | ICD-10-CM | POA: Diagnosis not present

## 2015-05-04 DIAGNOSIS — J45901 Unspecified asthma with (acute) exacerbation: Secondary | ICD-10-CM | POA: Diagnosis not present

## 2015-05-04 MED ORDER — ALBUTEROL SULFATE (2.5 MG/3ML) 0.083% IN NEBU: 2.5000 mg | INHALATION_SOLUTION | RESPIRATORY_TRACT | Status: DC | PRN

## 2015-05-04 MED ORDER — BECLOMETHASONE DIPROPIONATE 40 MCG/ACT IN AERS: 2.0000 | INHALATION_SPRAY | Freq: Two times a day (BID) | RESPIRATORY_TRACT | Status: DC

## 2015-05-04 MED ORDER — PREDNISONE 1 MG PO TABS
10.0000 mg | ORAL_TABLET | ORAL | Status: DC
Start: 2015-05-04 — End: 2016-10-24

## 2015-05-04 MED ORDER — PREDNISONE 1 MG PO TABS: 10.0000 mg | ORAL_TABLET | ORAL | Status: DC

## 2015-05-04 MED ORDER — ALBUTEROL SULFATE 108 (90 BASE) MCG/ACT IN AEPB: 2.0000 | INHALATION_SPRAY | Freq: Two times a day (BID) | RESPIRATORY_TRACT | Status: DC

## 2015-05-04 NOTE — Assessment & Plan Note (Addendum)
   Peggy Wallace has been encouraged to continue/complete the course of azithromycin as prescribed.  Prednisone has been provided (as above).  Nasal saline lavage (NeilMed) as needed has been recommended along with instructions for proper administration.  Continue triamcinolone nasal spray as needed.

## 2015-05-04 NOTE — Assessment & Plan Note (Addendum)
   Prednisone has been provided, 40 mg x3 days, 20 mg x1 day, 10 mg x1 day, then stop.  During respiratory tract infections and asthma flares, the patient may add Qvar 40 g, 2 inhalations twice a day until symptoms have returned to baseline.  A sample and prescription have been provided for Qvar 40 g.  To maximize pulmonary deposition, a spacer has been provided along with instructions for its proper administration with an HFA inhaler.  Continue albuterol every 4-6 hours as needed.  The patient has been asked to contact me if her symptoms persist or progress. Otherwise, she may return for follow up in 4 months.

## 2015-05-04 NOTE — Assessment & Plan Note (Signed)
   Continue appropriate allergen avoidance measures and triamcinolone nasal spray as needed.  In addition, nasal saline lavage has been recommended.

## 2015-05-04 NOTE — Patient Instructions (Addendum)
Asthma with acute exacerbation  Prednisone has been provided, 40 mg x3 days, 20 mg x1 day, 10 mg x1 day, then stop.  During respiratory tract infections and asthma flares, the patient may add Qvar 40 g, 2 inhalations twice a day until symptoms have returned to baseline.  A sample and prescription have been provided for Qvar 40 g.  To maximize pulmonary deposition, a spacer has been provided along with instructions for its proper administration with an HFA inhaler.  Continue albuterol every 4-6 hours as needed.  The patient has been asked to contact me if her symptoms persist or progress. Otherwise, she may return for follow up in 4 months.  Acute sinusitis  Sarissa has been encouraged to continue/complete the course of azithromycin as prescribed.  Prednisone has been provided (as above).  Nasal saline lavage (NeilMed) as needed has been recommended along with instructions for proper administration.  Continue triamcinolone nasal spray as needed.  Allergic rhinitis  Continue appropriate allergen avoidance measures and triamcinolone nasal spray as needed.  In addition, nasal saline lavage has been recommended.  GERD  Continue appropriate lifestyle modifications and omeprazole as previously prescribed.    Return in about 4 months (around 09/01/2015), or if symptoms worsen or fail to improve.
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
## 2015-05-04 NOTE — Assessment & Plan Note (Addendum)
   Continue appropriate lifestyle modifications and omeprazole as previously prescribed.

## 2015-05-08 ENCOUNTER — Ambulatory Visit (INDEPENDENT_AMBULATORY_CARE_PROVIDER_SITE_OTHER): Payer: BLUE CROSS/BLUE SHIELD | Admitting: Allergy and Immunology

## 2015-05-08 VITALS — BP 110/75 | HR 75 | Temp 97.5°F | Resp 17

## 2015-05-08 DIAGNOSIS — J45901 Unspecified asthma with (acute) exacerbation: Secondary | ICD-10-CM | POA: Diagnosis not present

## 2015-05-08 MED ORDER — IPRATROPIUM BROMIDE 0.02 % IN SOLN: 0.5000 mg | Freq: Once | RESPIRATORY_TRACT | Status: DC

## 2015-05-08 MED ORDER — LEVALBUTEROL HCL 1.25 MG/3ML IN NEBU: 1.2500 mg | INHALATION_SOLUTION | Freq: Once | RESPIRATORY_TRACT | Status: DC

## 2015-05-08 NOTE — Patient Instructions (Addendum)
   Complete additional days of Prednisone---30mg  for 3 days.  Amoxil 875mg  twice daily for 10 days.  Saline nasal wash 2-4 times daily.  Symbicort 2 puffs twice daily-- with spacer,  then return to the QVAR.  ProAir respiclick or albuterol neb every 4 hours as needed for cough or wheeze.  Continue Nasacort and Zyrtec as previously.  Follow-up in 2 weeks or sooner if needed.

## 2015-05-08 NOTE — Progress Notes (Signed)
FOLLOW UP NOTE  RE: Peggy Wallace MRN: [id number] DOB: [birth year] [facility] [address] [place] [address] Date of Office Visit: 05/08/2015  Subjective:  Peggy Wallace is a 50 y.o. female who presents today for Cough; Wheezing; Nasal Congestion; and Headache  Assessment:   1. Asthma exacerbation, in no respiratory distress, responsive to bronchodilator.    2.      Recent respiratory infection with persisting congestion. 3.      Possible secondary bacterial sinusitis in a patient with selective IgA deficiency. 4.      History of allergic rhinoconjunctivitis and Hymenoptera venom hypersensitivity. Plan:   Meds ordered this encounter  Medications  . levalbuterol (XOPENEX) nebulizer solution 1.25 mg    Sig:   . ipratropium (ATROVENT) nebulizer solution 0.5 mg    Sig:    Patient Instructions  1.  Complete additional days of Prednisone---30mg  for 3 days. 2.  Amoxil 875mg  twice daily for 10 days. 3.  Saline nasal wash 2-4 times daily. 4.  Symbicort 2 puffs twice daily-- with spacer (sample), then return to the QVAR. 5.  ProAir respiclick or albuterol neb every 4 hours as needed for cough or wheeze. 6.  Continue Nasacort and Zyrtec as previously. 7.  EpiPen, Benadryl as needed.   8.  Follow-up in 2 weeks or sooner if needed.  HPI: Peggy Wallace returns to the office with persisting congestion, sneezing, throat irritation, wheeze and raspy voice.  Since her visit with Dr. Nunzio Cobbs earlier in the week, she completed prednisone from our office and the azithromycin prescribed from her primary care physician.  She feels only minimal improvement and actually greater thick discolored drainage, intermittent chest congestion and sinus pressure/headache.  She denies fever or disrupted sleep and did use her Pro-Air today.  She has maintained on other medications, but does recall with other exacerbations, she has used Symbicort.  She  usually has not noted a great relief from azithromycin and tolerates penicillin family of antibiotics without issue..  Denies ED or urgent care visits, prednisone or antibiotic courses. Reports sleep and activity are normal.  Peggy Wallace has a current medication list which includes the following prescription(s): albuterol sulfate, beclomethasone, cetirizine, omeprazole, triamcinolone, and albuterol neb.  Drug Allergies: Allergies  Allergen Reactions  . Avelox [Moxifloxacin Hcl In Nacl]     c diff -- please do not give any Fluoroquinolones  . Morphine And Related Hives and Itching  . Sulfonamide Derivatives     Hives itching  . Cephalosporins Hives, Itching and Rash    No SOB - tolerates PCN   Objective:   Filed Vitals:   05/08/15 1351  BP: 110/75  Pulse: 75  Temp: 97.5 F (36.4 C)  Resp: 17   SpO2 Readings from Last 1 Encounters:  05/08/15 99%   Physical Exam  Constitutional: She is well-developed, well-nourished, and in no distress.  Non-toxic appearance.  Alert interactive communicating easily in full sentences.  HENT:  Head: Atraumatic.  Right Ear: Tympanic membrane and ear canal normal.  Left Ear: Tympanic membrane and ear canal normal.  Nose: Mucosal edema present. No rhinorrhea. No epistaxis.  Mouth/Throat: Oropharynx is clear and moist and mucous membranes are normal. No oropharyngeal exudate, posterior oropharyngeal edema or posterior oropharyngeal erythema.  Neck: Neck supple.  Cardiovascular: Normal rate, S1 normal and S2 normal.   No murmur heard. Pulmonary/Chest: Effort normal. No respiratory distress. She has wheezes (Faint end expiratory with good aeration.). She has no rhonchi.  She has no rales.  Post Xopenex/Atrovent neb: Continues to be clear without wheeze, rhonchi or crackles.  Patient reports improved.  Lymphadenopathy:    She has no cervical adenopathy.   Diagnostics: Spirometry:  FVC 2.24--70%, FEV1 1.68--64%, FEF 25-75%1.27--45%;  postbronchodilator  improvement,  FVC 2.49--78%, FEV1 1.89--72%, FEF 25-75% 1.46-52%.    Peggy Wallace M. Willa Rough, MD  cc: Gaspar Garbe, MD

## 2015-05-19 ENCOUNTER — Ambulatory Visit (INDEPENDENT_AMBULATORY_CARE_PROVIDER_SITE_OTHER): Payer: BLUE CROSS/BLUE SHIELD | Admitting: Allergy and Immunology

## 2015-05-19 ENCOUNTER — Ambulatory Visit: Payer: BLUE CROSS/BLUE SHIELD | Admitting: Allergy and Immunology

## 2015-05-19 ENCOUNTER — Encounter: Payer: Self-pay | Admitting: Allergy and Immunology

## 2015-05-19 VITALS — BP 118/78 | HR 72 | Resp 18

## 2015-05-19 DIAGNOSIS — J309 Allergic rhinitis, unspecified: Secondary | ICD-10-CM

## 2015-05-19 DIAGNOSIS — Z91030 Bee allergy status: Secondary | ICD-10-CM

## 2015-05-19 DIAGNOSIS — Z91038 Other insect allergy status: Secondary | ICD-10-CM | POA: Diagnosis not present

## 2015-05-19 DIAGNOSIS — H1010 Acute atopic conjunctivitis, unspecified eye: Secondary | ICD-10-CM | POA: Diagnosis not present

## 2015-05-19 DIAGNOSIS — K219 Gastro-esophageal reflux disease without esophagitis: Secondary | ICD-10-CM

## 2015-05-19 DIAGNOSIS — J4540 Moderate persistent asthma, uncomplicated: Secondary | ICD-10-CM

## 2015-05-19 DIAGNOSIS — J387 Other diseases of larynx: Secondary | ICD-10-CM

## 2015-05-19 DIAGNOSIS — D802 Selective deficiency of immunoglobulin A [IgA]: Secondary | ICD-10-CM

## 2015-05-19 MED ORDER — OMEPRAZOLE-SODIUM BICARBONATE 40-1100 MG PO CAPS: ORAL_CAPSULE | ORAL | Status: DC

## 2015-05-19 NOTE — Patient Instructions (Signed)
  1. Zegerid 40 mg twice a day. Requires prior authorization  2. Over-the-counter Rhinocort one spray each nostril 3-7 times per week  3. Symbicort 160 2 inhalations twice a day  4. If needed:   A. nasal saline wash  B. pro-air respiclick or albuterol nebulization  C. over-the-counter antihistamine  D. EpiPen  5. Further treatment?  6. Return to clinic in 6 months or earlier if problem
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
## 2015-06-03 ENCOUNTER — Telehealth: Payer: Self-pay

## 2015-06-03 NOTE — Telephone Encounter (Signed)
Has failed once a day therapy.

## 2015-06-03 NOTE — Telephone Encounter (Signed)
Called and spoke to Sam at [facility] and they will get this approved and to the pharmacy.

## 2015-06-03 NOTE — Telephone Encounter (Signed)
[employer] called and wanted to know the reason for Myles to take Zegerid twice a day.  Her insurance will only pay once a day.

## 2015-07-28 ENCOUNTER — Ambulatory Visit: Payer: BLUE CROSS/BLUE SHIELD | Admitting: Allergy and Immunology

## 2015-08-12 ENCOUNTER — Other Ambulatory Visit: Payer: Self-pay

## 2015-08-12 MED ORDER — DEXLANSOPRAZOLE 60 MG PO CPDR: 60.0000 mg | DELAYED_RELEASE_CAPSULE | Freq: Every day | ORAL | Status: DC

## 2015-08-12 NOTE — Telephone Encounter (Signed)
Will change medication from Omeprazole-Bicarb to Dexilant 60 mg. Patient has tried and failed Nexium, Omeprazole, and Ranitidine.

## 2015-11-11 ENCOUNTER — Encounter: Payer: 59 | Attending: Internal Medicine | Admitting: Dietician

## 2015-11-11 ENCOUNTER — Encounter: Payer: Self-pay | Admitting: Dietician

## 2015-11-11 DIAGNOSIS — E669 Obesity, unspecified: Secondary | ICD-10-CM | POA: Insufficient documentation

## 2015-11-11 DIAGNOSIS — Z713 Dietary counseling and surveillance: Secondary | ICD-10-CM | POA: Insufficient documentation

## 2015-11-11 DIAGNOSIS — R635 Abnormal weight gain: Secondary | ICD-10-CM

## 2015-11-11 NOTE — Progress Notes (Signed)
  Medical Nutrition Therapy:  Appt start time: 1130 end time:  1225.   Assessment:  Primary concerns today: Peggy Wallace is here today to discuss her weight. She states she has gained and lost weight in the past. Lost 50 pounds 2 years ago and regained that as well as 17 additional pounds. She exercises frequently but "loves sweets." Has recently felt like she was really trying to lose weight and has not been successful. In the last week she has tried to avoid sugar, eat healthier overall, and plan meals. Feels like gluten and dairy promote swelling and inflammation. Peggy Wallace sees a therapist regularly. Feels like she does not have a healthy relationship with food but considers herself a "foodie." Feels like she is an emotional eater, especially during times of insecurity, stress, and sadness. Thyroid and hormone levels are being tested. Keeps a food diary and has done this off and on for years. Uses [employer] and [employer]. Takes Melatonin to stay asleep at night. She lives with her boyfriend and 2 year old son. They usually eat outside on the patio or at the kitchen table. Peggy Wallace reports healthy, supportive relationships with her boyfriend and son. She is a Emergency planning/management officer for a [employer].   Preferred Learning Style:   No preference indicated   Learning Readiness:   Ready   MEDICATIONS: see list   DIETARY INTAKE:  Usual eating pattern includes 3 meals and 2 snacks per day. Avoided foods include large amounts of gluten and dairy.    24-hr recall: Wakes up around 6:10-6:20 am  B ( AM): homemade decaf latte with sweetened almond milk; sausage, egg, and tomato (no carb muffins)   Snk ( AM): "Energy bites" with cranberry, chia, cocoa powder, and protein powder   L ( PM): leftovers (gluten free pasta with avocado, vegetables, and grilled chicken) Snk ( PM): fruit or Energy bites D ( PM): protein shake or dinner within an hour of exercise Snk ( PM): dessert: 1/2  an orange and dark chocolate truffle  Beverages: did not assess today  Usual physical activity: "OC" 22-month challenge (online fitness videos); Body Pump 3x a week, walking, swimming, and biking  Estimated energy needs: 1800-2000 calories  Progress Towards Goal(s):  In progress.   Nutritional Diagnosis:  [place]-3.4 Unintentional weight gain As related to history of dieting and history of inappropriate food choices and excessive energy intake.  As evidenced by patient reports recent significant weight regain.    Intervention:  Nutrition counseling provided. Praised patient on recent healthy lifestyle changes. Encouraged continued improvement of self care and relationship with food. Discussed strategies for improving food choices. Goals:  -Continue with exercise routine (variety and consistency) -Keep working on self-care! (don't feel guilty about being a little selfish) -Continue meal prepping and including a variety -Continue to enjoy a variety of food -Continue to enjoy good quality desserts -Keep eating at the table with your family -Start listening to your hunger and fullness cues  -Work on eating slowly, chewing thoroughly, and enjoying every bite  -Wait a few minutes before getting seconds -Start taking Calcium and Vitamin D supplements  Teaching Method Utilized:  Visual Auditory Hands on  Handouts given during visit include:  none  Barriers to learning/adherence to lifestyle change: none  Demonstrated degree of understanding via:  Teach Back   Monitoring/Evaluation:  Dietary intake, exercise, and body weight in 1 month(s).

## 2015-11-11 NOTE — Patient Instructions (Addendum)
-  Continue with exercise routine (variety and consistency)  -Keep working on self-care! (don't feel guilty about being a little selfish)  -Continue meal prepping and including a variety  -Continue to enjoy a variety of food -Continue to enjoy good quality desserts  -Keep eating at the table with your family -Start listening to your hunger and fullness cues  -Work on eating slowly, chewing thoroughly, and enjoying every bite  -Wait a few minutes before getting seconds  -Start taking Calcium and Vitamin D supplements

## 2015-12-10 ENCOUNTER — Encounter: Payer: 59 | Admitting: Dietician

## 2015-12-10 ENCOUNTER — Encounter: Payer: Self-pay | Admitting: Dietician

## 2015-12-10 DIAGNOSIS — Z713 Dietary counseling and surveillance: Secondary | ICD-10-CM | POA: Diagnosis not present

## 2015-12-10 DIAGNOSIS — R635 Abnormal weight gain: Secondary | ICD-10-CM

## 2015-12-10 NOTE — Progress Notes (Signed)
  Medical Nutrition Therapy:  Appt start time: 920 end time:  1005   Assessment:  Primary concerns today: Peggy Wallace is here today to discuss her weight. She states she has gained and lost weight in the past. Lost 50 pounds 2 years ago and regained that as well as 17 additional pounds. She exercises frequently but "loves sweets." Has recently felt like she was really trying to lose weight and has not been successful. In the last week she has tried to avoid sugar, eat healthier overall, and plan meals. Feels like gluten and dairy promote swelling and inflammation. Peggy Wallace sees a therapist regularly. Feels like she does not have a healthy relationship with food but considers herself a "foodie." Feels like she is an emotional eater, especially during times of insecurity, stress, and sadness. Thyroid and hormone levels are being tested. Keeps a food diary and has done this off and on for years. Uses [employer] and [employer]. Takes Melatonin to stay asleep at night. She lives with her boyfriend and 51 year old son. They usually eat outside on the patio or at the kitchen table. Peggy Wallace reports healthy, supportive relationships with her boyfriend and son. She is a Emergency planning/management officer for a [employer].   Follow up: Declined updated weight today. Just got back from a week at the beach and states she is back down to her normal weight. Found out that she is going through menopause. Feels like she may be working out too hard and possibly increasing cortisol levels.  Started taking calcium and vitamin D. Has continued with her workout challenge that includes core work, balance, endurance, and strength. Has been practicing self care.   Preferred Learning Style:   No preference indicated   Learning Readiness:   Ready   MEDICATIONS: see list   DIETARY INTAKE:  Usual eating pattern includes 3 meals and 2 snacks per day. Avoided foods include large amounts of gluten and dairy.    24-hr  recall: Wakes up around 6:10-6:20 am  B ( AM): homemade decaf latte with sweetened almond milk; sausage, egg, and tomato (no carb muffins)   Snk ( AM): "Energy bites" with cranberry, chia, cocoa powder, and protein powder   L ( PM): leftovers (gluten free pasta with avocado, vegetables, and grilled chicken) Snk ( PM): fruit or Energy bites D ( PM): protein shake or dinner within an hour of exercise Snk ( PM): dessert: 1/2 an orange and dark chocolate truffle  Beverages: did not assess today  Usual physical activity: "OC" 62-month challenge (online fitness videos); Body Pump 3x a week, walking, swimming, and biking  Estimated energy needs: 1800-2000 calories  Progress Towards Goal(s):  In progress.   Nutritional Diagnosis:  [place]-3.4 Unintentional weight gain As related to history of dieting and history of inappropriate food choices and excessive energy intake.  As evidenced by patient reports recent significant weight regain.    Intervention:  Nutrition counseling provided. Praised patient on recent healthy lifestyle changes. Encouraged continued improvement of self care and relationship with food. Discussed strategies for improving food choices.  Teaching Method Utilized:  Visual Auditory Hands on  Handouts given during visit include:  none  Barriers to learning/adherence to lifestyle change: none  Demonstrated degree of understanding via:  Teach Back   Monitoring/Evaluation:  Dietary intake, exercise, and body weight in 1 month(s).

## 2015-12-10 NOTE — Patient Instructions (Addendum)
-  Continue with exercise routine (variety and consistency)  -Listen to your body! Rest when you need to.  -Include active recovery (Restore session, yoga)  -Keep working on self-care! (don't feel guilty about being a little selfish)  -Continue meal prepping and including a variety  -Make it an event  -Make extra and freeze for leftovers  -Continue to enjoy a variety of food -Continue to enjoy good quality desserts  -Keep eating at the table with your family -Start listening to your hunger and fullness cues  -Work on eating slowly, chewing thoroughly, and enjoying every bite  -Wait a few minutes before getting seconds  -Talk to your doctor about adding Miralax to smoothie if Benefiber doesn't help

## 2015-12-31 ENCOUNTER — Other Ambulatory Visit: Payer: Self-pay | Admitting: Orthopedic Surgery

## 2015-12-31 DIAGNOSIS — M25572 Pain in left ankle and joints of left foot: Secondary | ICD-10-CM

## 2016-01-03 ENCOUNTER — Ambulatory Visit
Admission: RE | Admit: 2016-01-03 | Discharge: 2016-01-03 | Disposition: A | Discharge status: Home / Self Care | Payer: 59 | Source: Ambulatory Visit | Attending: Orthopedic Surgery | Admitting: Orthopedic Surgery

## 2016-01-03 DIAGNOSIS — M25572 Pain in left ankle and joints of left foot: Secondary | ICD-10-CM

## 2016-01-13 ENCOUNTER — Ambulatory Visit (INDEPENDENT_AMBULATORY_CARE_PROVIDER_SITE_OTHER): Payer: BLUE CROSS/BLUE SHIELD | Admitting: Orthopedic Surgery

## 2016-01-14 ENCOUNTER — Ambulatory Visit: Payer: BLUE CROSS/BLUE SHIELD | Admitting: Dietician

## 2016-01-26 ENCOUNTER — Ambulatory Visit (INDEPENDENT_AMBULATORY_CARE_PROVIDER_SITE_OTHER): Payer: 59 | Admitting: Allergy and Immunology

## 2016-01-26 ENCOUNTER — Encounter: Payer: Self-pay | Admitting: Allergy and Immunology

## 2016-01-26 VITALS — BP 108/68 | HR 72 | Resp 16

## 2016-01-26 DIAGNOSIS — Z91038 Other insect allergy status: Secondary | ICD-10-CM

## 2016-01-26 DIAGNOSIS — Z91030 Bee allergy status: Secondary | ICD-10-CM

## 2016-01-26 NOTE — Progress Notes (Signed)
Peggy Wallace presents today to have skin testing performed in evaluation of Hymenoptera venom hypersensitivity. She developed rather significant throat tightness on 2 occasions in 2006 in 2007 following a wasp sting without any associated systemic or constitutional symptoms. Skin testing against flying Hymenoptera venom was performed. She did not demonstrate any hypersensitivity against a series of Hymenoptera venom including honeybee, yellow jacket, yellow faced hornet, white faced hornet, and wasp up to a concentration of 1.0 g per mL intradermal. Given the fact that her reaction directed against Hymenoptera was basically associated with this sensation of some throat tightness and no other associated systemic or constitutional symptoms and this occurred approximately 10 years ago I think we will hold off on any further evaluation at this point in time. She can still continue to carry an EpiPen at this point.

## 2016-03-08 ENCOUNTER — Ambulatory Visit (INDEPENDENT_AMBULATORY_CARE_PROVIDER_SITE_OTHER): Payer: 59 | Admitting: Allergy and Immunology

## 2016-03-08 ENCOUNTER — Encounter: Payer: Self-pay | Admitting: Allergy and Immunology

## 2016-03-08 VITALS — BP 120/78 | HR 65 | Temp 98.2°F | Resp 16 | Ht 63.5 in | Wt 207.0 lb

## 2016-03-08 DIAGNOSIS — J0110 Acute frontal sinusitis, unspecified: Secondary | ICD-10-CM | POA: Diagnosis not present

## 2016-03-08 DIAGNOSIS — J3089 Other allergic rhinitis: Secondary | ICD-10-CM

## 2016-03-08 DIAGNOSIS — J45901 Unspecified asthma with (acute) exacerbation: Secondary | ICD-10-CM

## 2016-03-08 MED ORDER — PREDNISONE 1 MG PO TABS: 10.0000 mg | ORAL_TABLET | Freq: Every day | ORAL | Status: DC

## 2016-03-08 MED ORDER — AMOXICILLIN-POT CLAVULANATE 875-125 MG PO TABS: 1.0000 | ORAL_TABLET | Freq: Two times a day (BID) | ORAL | 0 refills | Status: DC

## 2016-03-08 MED ORDER — BUDESONIDE-FORMOTEROL FUMARATE 160-4.5 MCG/ACT IN AERO: 2.0000 | INHALATION_SPRAY | Freq: Two times a day (BID) | RESPIRATORY_TRACT | 4 refills | Status: DC

## 2016-03-08 NOTE — Assessment & Plan Note (Signed)
   Prednisone has been provided, 40 mg x3 days, 20 mg x1 day, 10 mg x1 day, then stop.  A sample and refill prescription have been provided for Symbicort 160/4.5 g, 2 inhalations via spacer device twice a day.  Continue albuterol every 4-6 hours as needed.  The patient has been asked to contact me if her symptoms persist or progress. Otherwise, she may return for follow up in 4 months.

## 2016-03-08 NOTE — Assessment & Plan Note (Signed)
   Adalei has been prescribed Augmentin 875/125 mg, 1 by mouth twice a day 10 days.    Prednisone has been provided (as above).  Nasal saline lavage (NeilMed) as needed has been recommended along with instructions for proper administration.  For thick post nasal drainage, add guaifenesin [phone] mg (Mucinex)  twice daily as needed with adequate hydration as discussed.  Continue triamcinolone nasal spray as needed.
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
## 2016-03-08 NOTE — Patient Instructions (Addendum)
Asthma with acute exacerbation  Prednisone has been provided, 40 mg x3 days, 20 mg x1 day, 10 mg x1 day, then stop.  A sample and refill prescription have been provided for Symbicort 160/4.5 g, 2 inhalations via spacer device twice a day.  Continue albuterol every 4-6 hours as needed.  The patient has been asked to contact me if her symptoms persist or progress. Otherwise, she may return for follow up in 4 months.  Acute sinusitis  Deiona has been prescribed Augmentin 875/125 mg, 1 by mouth twice a day 10 days.    Prednisone has been provided (as above).  Nasal saline lavage (NeilMed) as needed has been recommended along with instructions for proper administration.  For thick post nasal drainage, add guaifenesin [phone] mg (Mucinex)  twice daily as needed with adequate hydration as discussed.  Continue triamcinolone nasal spray as needed.  Allergic rhinitis  Continue allergen avoidance measures, triamcinolone nasal spray, and nasal saline irrigation as needed.   Return in about 4 months (around 07/06/2016), or if symptoms worsen or fail to improve.

## 2016-03-08 NOTE — Assessment & Plan Note (Signed)
   Continue allergen avoidance measures, triamcinolone nasal spray, and nasal saline irrigation as needed.

## 2016-03-28 ENCOUNTER — Other Ambulatory Visit: Payer: Self-pay | Admitting: *Deleted

## 2016-03-28 MED ORDER — OMEPRAZOLE 40 MG PO CPDR: 40.0000 mg | DELAYED_RELEASE_CAPSULE | Freq: Two times a day (BID) | ORAL | 5 refills | Status: DC

## 2016-05-25 ENCOUNTER — Telehealth: Payer: Self-pay | Admitting: Allergy and Immunology

## 2016-05-25 MED ORDER — PREDNISONE 10 MG PO TABS: ORAL_TABLET | ORAL | 0 refills | Status: DC

## 2016-05-25 MED ORDER — AMOXICILLIN-POT CLAVULANATE 875-125 MG PO TABS: 1.0000 | ORAL_TABLET | Freq: Two times a day (BID) | ORAL | 0 refills | Status: DC

## 2016-05-25 NOTE — Telephone Encounter (Signed)
Called patient to inform her that Dr. Lucie Leather wanted her to start Augmentin 875 one tablet twice a day for 10 days, and prednisone 10mg  1 tablet for 10 days. Informed her we sent it in to the [facility].

## 2016-05-25 NOTE — Telephone Encounter (Signed)
Please advise 

## 2016-05-25 NOTE — Telephone Encounter (Signed)
Can use Augmentin 875 one tablet twice a day for 10 days and prednisone 10 mg a day for 10 days. Use lots of probiotic given previous history of Clostridium difficile infection with antibiotic use.

## 2016-05-25 NOTE — Telephone Encounter (Signed)
Patient saw Dr. Lucie Leather last on 01-26-16. She thinks she has a sinus infection because her face hurts and stuffed up nose for 2 weeks. She wants to know if something can be called in for her. Pharmacy is [facility].

## 2016-06-02 ENCOUNTER — Telehealth: Payer: Self-pay | Admitting: Allergy and Immunology

## 2016-06-02 NOTE — Telephone Encounter (Signed)
Please inform patient that if she is not better she will need to be seen. She can see one of [place] in the [place] clinic on Friday or tonight

## 2016-06-02 NOTE — Telephone Encounter (Signed)
Patient has called stating that Peggy Wallace is treating her for a sinus infection. Although patient has NOT been seen by Peggy Wallace and has not finished her round of antibiotics, the patient states that the sinus infection is not getting better and in fact there is worsening pressure/pain over her eyes. Does the patient need to see Peggy Wallace?? Does the patient need to go to ENT?? Please call patient to answer any questions

## 2016-06-02 NOTE — Telephone Encounter (Signed)
Please advise? Would you like this patient to be seen in office?

## 2016-06-03 NOTE — Telephone Encounter (Signed)
Spoke with patient appt made for 06/06/16 @ 1045 with Dr Nunzio Cobbs

## 2016-06-06 ENCOUNTER — Encounter: Payer: Self-pay | Admitting: Allergy and Immunology

## 2016-06-06 ENCOUNTER — Ambulatory Visit (INDEPENDENT_AMBULATORY_CARE_PROVIDER_SITE_OTHER): Payer: 59 | Admitting: Allergy and Immunology

## 2016-06-06 VITALS — BP 100/66 | HR 62 | Temp 98.2°F | Resp 16 | Ht 64.0 in | Wt 189.0 lb

## 2016-06-06 DIAGNOSIS — J3089 Other allergic rhinitis: Secondary | ICD-10-CM | POA: Diagnosis not present

## 2016-06-06 DIAGNOSIS — D802 Selective deficiency of immunoglobulin A [IgA]: Secondary | ICD-10-CM

## 2016-06-06 DIAGNOSIS — J4530 Mild persistent asthma, uncomplicated: Secondary | ICD-10-CM

## 2016-06-06 DIAGNOSIS — J0110 Acute frontal sinusitis, unspecified: Secondary | ICD-10-CM | POA: Diagnosis not present

## 2016-06-06 DIAGNOSIS — J4540 Moderate persistent asthma, uncomplicated: Secondary | ICD-10-CM | POA: Insufficient documentation

## 2016-06-06 MED ORDER — ALBUTEROL SULFATE HFA 108 (90 BASE) MCG/ACT IN AERS: INHALATION_SPRAY | RESPIRATORY_TRACT | 1 refills | Status: DC

## 2016-06-06 MED ORDER — BUDESONIDE 0.5 MG/2ML IN SUSP: RESPIRATORY_TRACT | 1 refills | Status: DC

## 2016-06-06 MED ORDER — MONTELUKAST SODIUM 10 MG PO TABS: 10.0000 mg | ORAL_TABLET | Freq: Every day | ORAL | 3 refills | Status: DC

## 2016-06-06 MED ORDER — AMOXICILLIN-POT CLAVULANATE 875-125 MG PO TABS: 1.0000 | ORAL_TABLET | Freq: Two times a day (BID) | ORAL | 0 refills | Status: AC

## 2016-06-06 NOTE — Assessment & Plan Note (Addendum)
   Prednisone has been provided, 40 mg x3 days, 20 mg x1 day, 10 mg x1 day, then stop.  A prescription has been provided for Augmentin 875-125 mg twice a day 14 days.  Start budesonide/saline nasal irrigation twice a day.  A prescription has been provided for budesonide 0.5 mg respules and instructions for mixing and adminstering the rinse have been discussed and provided in written form.  For thick post nasal drainage, nasal congestion, and/or sinus pressure, add guaifenesin [phone] mg (Mucinex) plus/minus pseudoephedrine 60-120 mg  twice daily as needed with adequate hydration as discussed. Pseudoephedrine is only to be used for short-term relief of nasal/sinus congestion. Long-term use is discouraged due to potential side effects.  The patient has been asked to contact me if her symptoms persist or progress. Otherwise, she may return for follow up in 4 months.
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
## 2016-06-06 NOTE — Assessment & Plan Note (Signed)
   A prescription has been provided for montelukast 10 mg daily at bedtime.  For now, and during all upper respirator tract infections and asthma flares, add Symbicort 160-4.5 g, 2 inhalations via spacer device twice a day until symptoms have returned baseline.  Albuterol HFA, 1-2 inhalations every 4-6 hours as needed.  Subjective and objective measures of pulmonary function will be followed and the treatment plan will be adjusted accordingly.

## 2016-06-06 NOTE — Patient Instructions (Addendum)
Acute sinusitis  Prednisone has been provided, 40 mg x3 days, 20 mg x1 day, 10 mg x1 day, then stop.  A prescription has been provided for Augmentin 875-125 mg twice a day 14 days.  Start budesonide/saline nasal irrigation twice a day.  A prescription has been provided for budesonide 0.5 mg respules and instructions for mixing and adminstering the rinse have been discussed and provided in written form.  For thick post nasal drainage, nasal congestion, and/or sinus pressure, add guaifenesin [phone] mg (Mucinex) plus/minus pseudoephedrine 60-120 mg  twice daily as needed with adequate hydration as discussed. Pseudoephedrine is only to be used for short-term relief of nasal/sinus congestion. Long-term use is discouraged due to potential side effects.  The patient has been asked to contact me if her symptoms persist or progress. Otherwise, she may return for follow up in 4 months.  Mild persistent asthma  A prescription has been provided for montelukast 10 mg daily at bedtime.  For now, and during all upper respirator tract infections and asthma flares, add Symbicort 160-4.5 g, 2 inhalations via spacer device twice a day until symptoms have returned baseline.  Albuterol HFA, 1-2 inhalations every 4-6 hours as needed.  Subjective and objective measures of pulmonary function will be followed and the treatment plan will be adjusted accordingly.  Allergic rhinitis  Continue allergen avoidance measures, triamcinolone nasal spray as needed, and nasal saline irrigation as needed.  At the first signs/symptoms of sinus infection, start nasal steroid irrigation until symptoms have returned baseline.   Return in about 4 months (around 10/04/2016), or if symptoms worsen or fail to improve.   Budesonide (Pulmicort) + Saline Irrigation/Rinse  Budesonide (Pulmicort) is an anti-inflammatory steroid medication used to decrease nasal and sinus inflammation. It is dispensed in liquid form in a vial.  Although it is manufactured for use with a nebulizer, we intend for you to use it with the NeilMed Sinus Rinse bottle (preferred) or a Neti pot.   Instructions:  1) Make 240cc of saline in the NeilMed bottle using the salt packets or your own saline recipe (see separate handout).  2) Add the entire 2cc vial of liquid Budesonide (Pulmicort) to the rinse bottle and mix together.  3) While in the shower or over the sink, tilt your head forward to a comfortable level. Put the tip of the sinus rinse bottle in your nostril and aim it towards the crown or top of your head. Gently squeeze the bottle to flush out your nose. The fluid will circulate in and out of your sinus cavities, coming back out from either nostril or through your mouth. Try not to swallow large quantities and spit it out instead.  4) Perform Budesonide (Pulmicort) + Saline irrigations 2 times daily.

## 2016-06-06 NOTE — Assessment & Plan Note (Signed)
   Continue allergen avoidance measures, triamcinolone nasal spray as needed, and nasal saline irrigation as needed.  At the first signs/symptoms of sinus infection, start nasal steroid irrigation until symptoms have returned baseline.

## 2016-07-18 ENCOUNTER — Other Ambulatory Visit: Payer: Self-pay | Admitting: *Deleted

## 2016-07-18 MED ORDER — OMEPRAZOLE 40 MG PO CPDR: 40.0000 mg | DELAYED_RELEASE_CAPSULE | Freq: Two times a day (BID) | ORAL | 1 refills | Status: DC

## 2016-07-18 NOTE — Telephone Encounter (Signed)
Per fax request for 90 day Omeprazole

## 2016-07-26 ENCOUNTER — Ambulatory Visit (INDEPENDENT_AMBULATORY_CARE_PROVIDER_SITE_OTHER): Payer: 59 | Admitting: Allergy and Immunology

## 2016-07-26 ENCOUNTER — Encounter: Payer: Self-pay | Admitting: Allergy and Immunology

## 2016-07-26 VITALS — BP 112/74 | HR 68 | Resp 16

## 2016-07-26 DIAGNOSIS — Z91038 Other insect allergy status: Secondary | ICD-10-CM

## 2016-07-26 DIAGNOSIS — J4540 Moderate persistent asthma, uncomplicated: Secondary | ICD-10-CM

## 2016-07-26 DIAGNOSIS — K219 Gastro-esophageal reflux disease without esophagitis: Secondary | ICD-10-CM | POA: Diagnosis not present

## 2016-07-26 DIAGNOSIS — Z91030 Bee allergy status: Secondary | ICD-10-CM

## 2016-07-26 DIAGNOSIS — J3089 Other allergic rhinitis: Secondary | ICD-10-CM

## 2016-07-26 DIAGNOSIS — D802 Selective deficiency of immunoglobulin A [IgA]: Secondary | ICD-10-CM | POA: Diagnosis not present

## 2016-07-26 MED ORDER — IPRATROPIUM BROMIDE 0.06 % NA SOLN
NASAL | 5 refills | Status: DC
Start: 2016-07-26 — End: 2017-08-29

## 2016-07-26 MED ORDER — SODIUM BICARBONATE 650 MG PO TABS
ORAL_TABLET | ORAL | 5 refills | Status: DC
Start: 2016-07-26 — End: 2017-08-22
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
## 2016-07-26 NOTE — Patient Instructions (Addendum)
  1. Treat and prevent inflammation:   A. Nasonex/nasacort 1-2 sprays each nostril one time per day  B. Symbicort 160 - 2 inhalations 1- 2 times per day  2. If needed:   A. ipratropium 0.06% 2 sprays each nostril every 6 hours to dry up nose  B. nasal saline wash  C. pro-air respiclick or albuterol nebulization  D. over-the-counter antihistamine  E. EpiPen  5. Continue omeprazole  6. Return to clinic in 6 months or earlier if problem

## 2016-10-04 DIAGNOSIS — R590 Localized enlarged lymph nodes: Secondary | ICD-10-CM | POA: Insufficient documentation

## 2016-10-24 ENCOUNTER — Ambulatory Visit (INDEPENDENT_AMBULATORY_CARE_PROVIDER_SITE_OTHER): Payer: 59 | Admitting: Allergy and Immunology

## 2016-10-24 ENCOUNTER — Encounter: Payer: Self-pay | Admitting: Allergy and Immunology

## 2016-10-24 VITALS — BP 120/72 | HR 76 | Resp 20

## 2016-10-24 DIAGNOSIS — J3089 Other allergic rhinitis: Secondary | ICD-10-CM

## 2016-10-24 DIAGNOSIS — J4541 Moderate persistent asthma with (acute) exacerbation: Secondary | ICD-10-CM

## 2016-10-24 DIAGNOSIS — K219 Gastro-esophageal reflux disease without esophagitis: Secondary | ICD-10-CM

## 2016-10-24 MED ORDER — FLUTICASONE PROPIONATE HFA 110 MCG/ACT IN AERO: 2.0000 | INHALATION_SPRAY | Freq: Two times a day (BID) | RESPIRATORY_TRACT | 5 refills | Status: DC

## 2016-10-24 MED ORDER — PREDNISONE 1 MG PO TABS: 10.0000 mg | ORAL_TABLET | Freq: Every day | ORAL | Status: AC

## 2016-10-24 NOTE — Assessment & Plan Note (Signed)
   Continue appropriate lifestyle modifications and omeprazole as prescribed. 
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
## 2016-10-24 NOTE — Assessment & Plan Note (Signed)
Currently with suboptimal control.  For now, continue Symbicort 160-4.5 g, 2 inhalations via spacer device twice a day, montelukast 10 mg daily at bedtime, and albuterol HFA, 1-2 inhalations every 4-6 hours as needed.  For now, and during respiratory tract infections or asthma flares, add Flovent 110g 2 inhalations 2 times per day until symptoms have returned to baseline.  Peggy Wallace will be leaving the country in a few days for a trip. As such, should her symptoms persist or progress, she may start prednisone 20 mg x 4 days, 10 mg x1 day, then stop, which has been provided.  The patient has been asked to contact me if her symptoms persist or progress. Otherwise, she may return for follow up in 4 months.

## 2016-10-24 NOTE — Assessment & Plan Note (Signed)
   Continue allergen avoidance measures and triamcinolone nasal spray as needed.  Nasal saline lavage (NeilMed) has been recommended as needed and prior to medicated nasal sprays along with instructions for proper administration.  At the first signs/symptoms of sinus infection, start nasal steroid irrigation until symptoms have returned baseline.

## 2016-10-24 NOTE — Patient Instructions (Signed)
Moderate persistent asthma Currently with suboptimal control.  For now, continue Symbicort 160-4.5 g, 2 inhalations via spacer device twice a day, montelukast 10 mg daily at bedtime, and albuterol HFA, 1-2 inhalations every 4-6 hours as needed.  For now, and during respiratory tract infections or asthma flares, add Flovent 110g 2 inhalations 2 times per day until symptoms have returned to baseline.  Breanah will be leaving the country in a few days for a trip. As such, should her symptoms persist or progress, she may start prednisone 20 mg x 4 days, 10 mg x1 day, then stop, which has been provided.  The patient has been asked to contact me if her symptoms persist or progress. Otherwise, she may return for follow up in 4 months.  Allergic rhinitis  Continue allergen avoidance measures and triamcinolone nasal spray as needed.  Nasal saline lavage (NeilMed) has been recommended as needed and prior to medicated nasal sprays along with instructions for proper administration.  At the first signs/symptoms of sinus infection, start nasal steroid irrigation until symptoms have returned baseline.  GERD  Continue appropriate lifestyle modifications and omeprazole as prescribed.   Return in about 4 months (around 02/24/2017), or if symptoms worsen or fail to improve.

## 2016-11-11 ENCOUNTER — Ambulatory Visit (INDEPENDENT_AMBULATORY_CARE_PROVIDER_SITE_OTHER): Payer: 59 | Admitting: Internal Medicine

## 2016-11-11 DIAGNOSIS — Z23 Encounter for immunization: Secondary | ICD-10-CM

## 2016-11-11 DIAGNOSIS — Z9189 Other specified personal risk factors, not elsewhere classified: Secondary | ICD-10-CM | POA: Diagnosis not present

## 2016-11-11 DIAGNOSIS — Z7184 Encounter for health counseling related to travel: Secondary | ICD-10-CM

## 2016-11-11 DIAGNOSIS — Z789 Other specified health status: Secondary | ICD-10-CM

## 2016-11-11 DIAGNOSIS — Z7189 Other specified counseling: Secondary | ICD-10-CM | POA: Diagnosis not present

## 2016-11-11 MED ORDER — AZITHROMYCIN 500 MG PO TABS: 500.0000 mg | ORAL_TABLET | Freq: Every day | ORAL | 0 refills | Status: DC

## 2016-11-11 NOTE — Progress Notes (Signed)
Subjective:   Peggy Wallace is a 51 y.o. female prior travel hx, has hx of selective IgA deficiencywho presents to the Infectious Disease clinic for travel consultation. Planned departure date: early November         Planned return date: 7 day cruise from [place] to [place] Countries of travel: [place] and [place] via boat Areas in country: rural in [place] Accommodations: boat and resorts Purpose of travel: vacation Prior travel out of [place]: yes     Objective:   Medications: Reviewed  Allergies  Allergen Reactions  . Avelox [Moxifloxacin Hcl In Nacl]     c diff -- please do not give any Fluoroquinolones  . Codeine Other (See Comments)    Other REACTION: rash  . Morphine And Related Hives and Itching  . Other Other (See Comments)    c diff -- please do not give any Fluoroquinolones  . Sulfamethoxazole Other (See Comments)    other  . Sulfonamide Derivatives     Hives itching  . Cephalosporins Hives, Itching and Rash    No SOB - tolerates PCN      Assessment:   No contraindications to travel. none   Plan:   Pre travel vaccinations = recommend to get typhoid injection booster, otherwise uptodate on travel vaccines  Traveler's diarrhea =will give rx for azithromycin in case has diarrhea. Gave precautions  Mosquito bite prevention = recommend to use DEET plus premethrin  Caving = avoid caves that may have bats/bat guano

## 2017-01-22 IMAGING — MR MR BREAST BILAT WO/W CM
6 of 13 series · 23 of 48 positions shown · IV contrast (18cc multihance)
Comparison: Prior exams from [REDACTED] and [HOSPITAL]
[HOSPITAL] 02/03/2015, 02/02/2015, 01/14/2015, and earlier

CLINICAL DATA: Possible right breast mass noted on recent mammogram
and ultrasound. History of implants and implant revision.

LABS:  None applicable
EXAM:
BILATERAL BREAST MRI WITH AND WITHOUT CONTRAST
TECHNIQUE: Multiplanar, multisequence MR images of both breasts were obtained
prior to and following the intravenous administration of 18 ml of
MultiHance.

[Series 2: T2 · axial · 3.0mm · 0.47mm/px · z∈[-121,+41]mm · 3 of 55 slices shown]
[im 1/55]
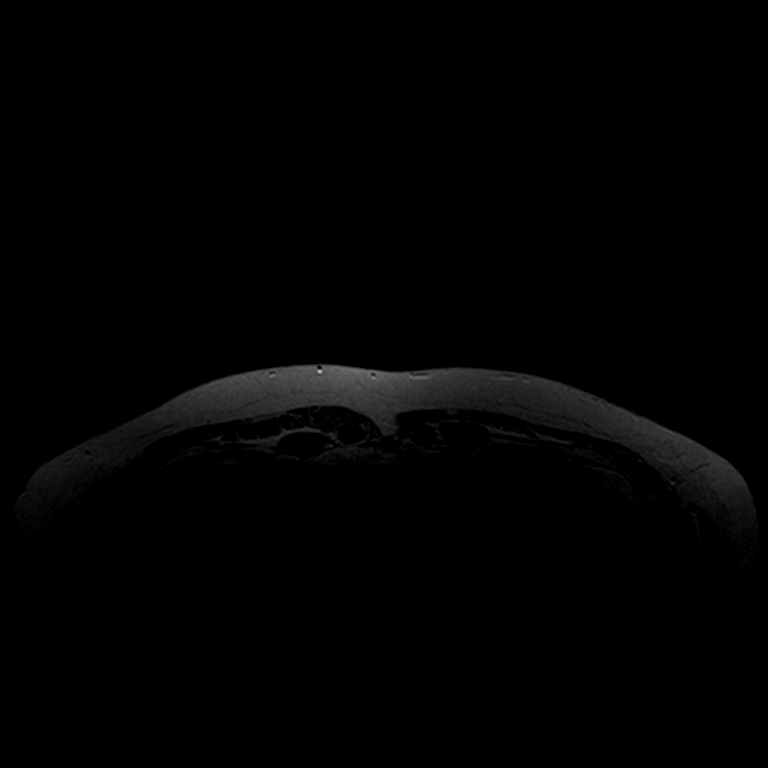
[im 28/55]
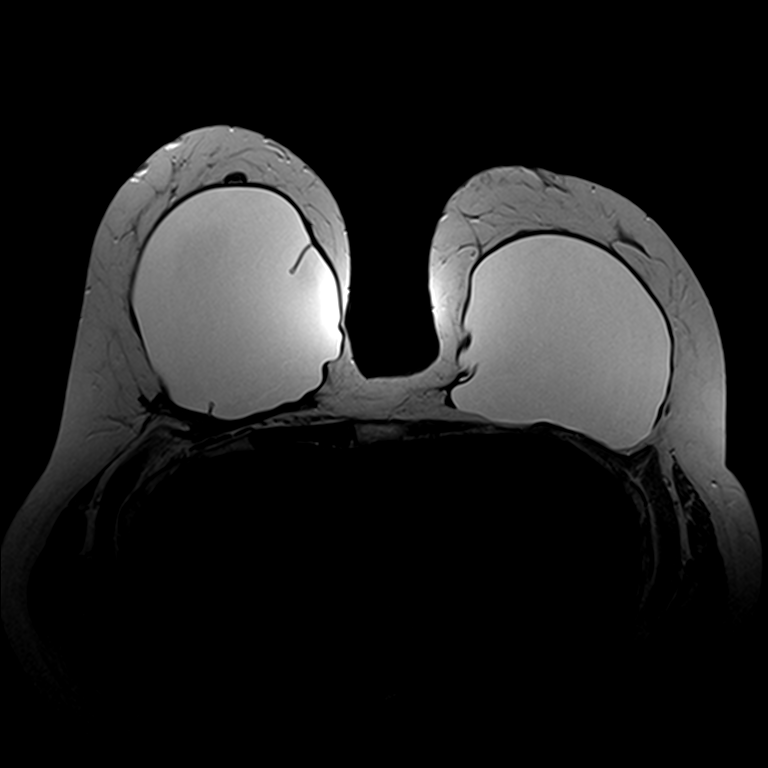
[im 55/55]
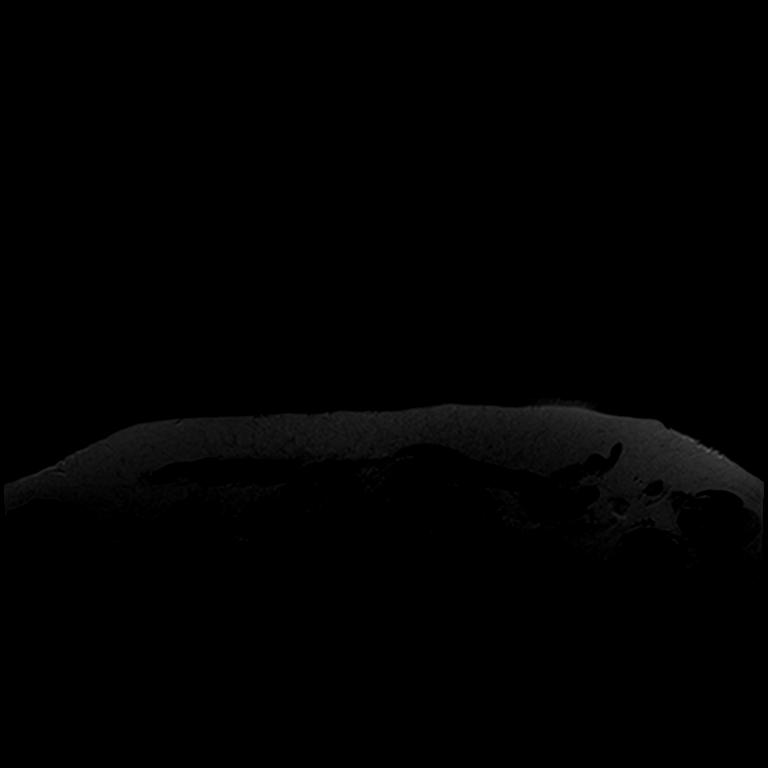

[Series 3: t2_tirm_tra ipat (a-p) · axial · 3.0mm · 0.70mm/px · z∈[-121,+41]mm · 2 of 55 slices shown]
[im 1/55]
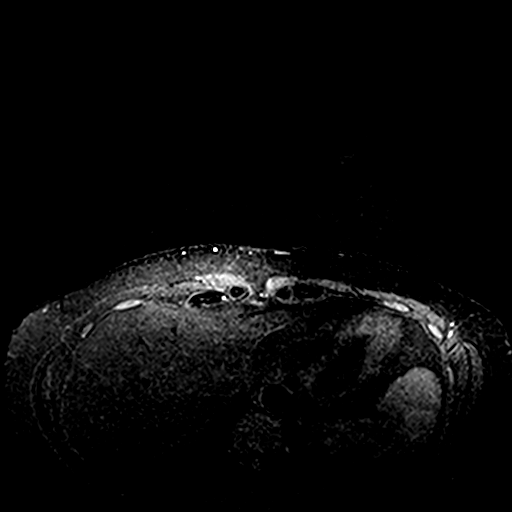
[im 55/55]
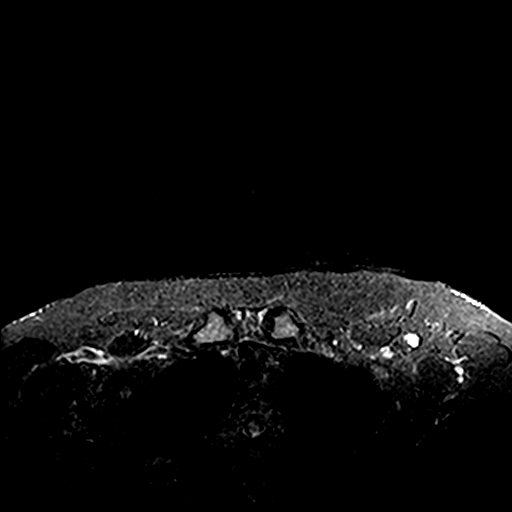

[Series 4: fl3d pre-cm no · axial · non-contrast · 1.2mm · 0.94mm/px · z∈[-126,+46]mm · 5 of 144 slices shown]
[im 1/144]
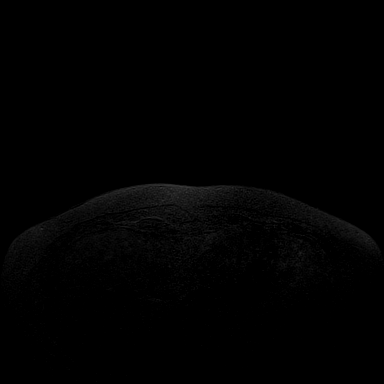
[im 36/144]
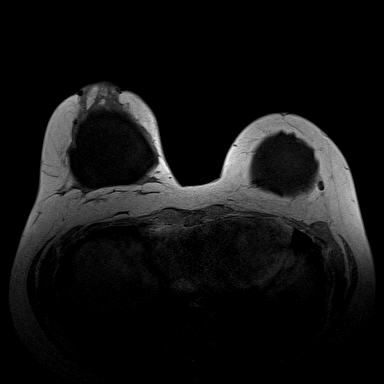
[im 72/144]
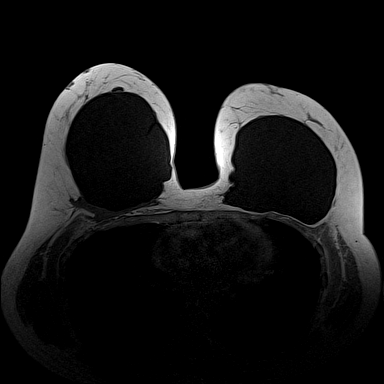
[im 108/144]
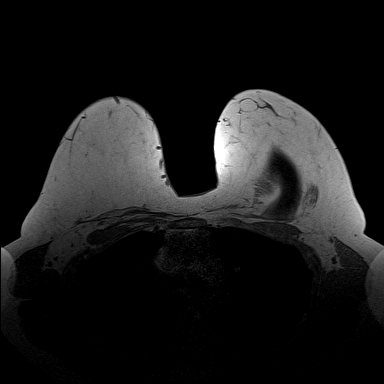
[im 144/144]
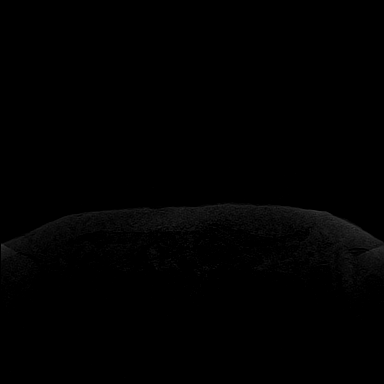

[Series 5: fl3d pre-cm · axial · non-contrast · 1.2mm · 0.94mm/px · z∈[-126,+46]mm · 5 of 144 slices shown]
[im 1/144]
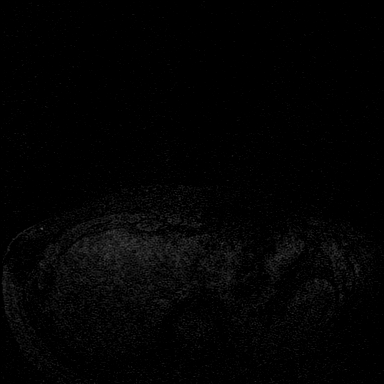
[im 36/144]
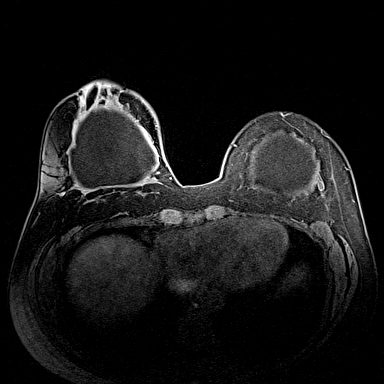
[im 72/144]
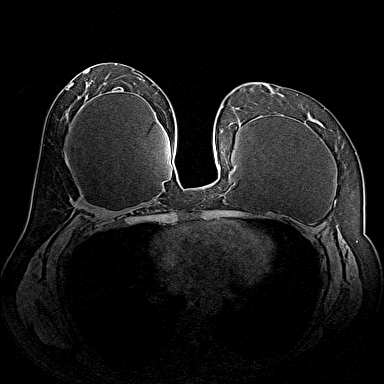
[im 108/144]
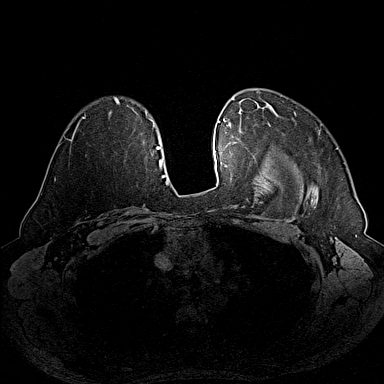
[im 144/144]
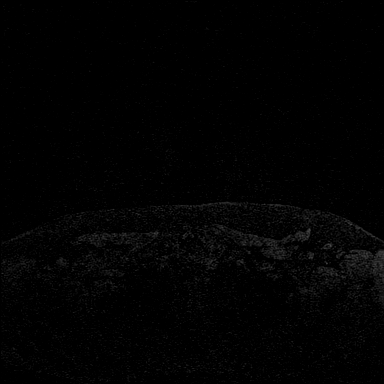

[Series 6: fl3d post immediate · axial · 1.2mm · 0.94mm/px · z∈[-126,+46]mm · 5 of 144 slices shown (1 of 2)]
[im 1/144]
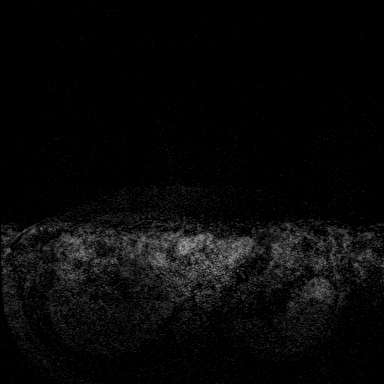
[im 36/144]
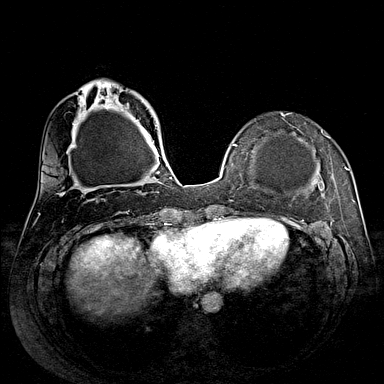
[im 72/144]
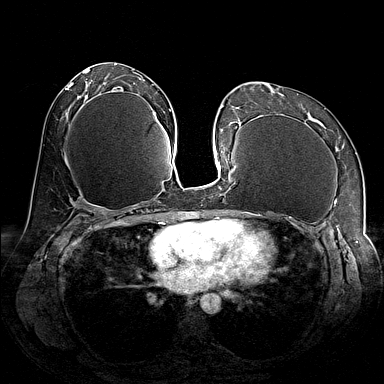
[im 108/144]
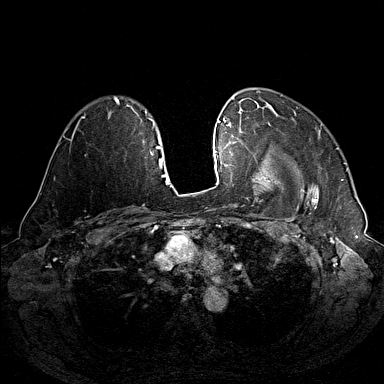
[im 144/144]
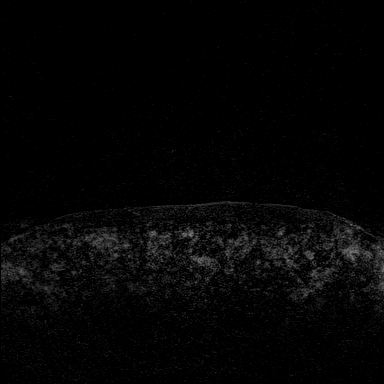

[Series 7: fl3d post immediate · axial · 1.2mm · 0.94mm/px · z∈[-126,-40]mm · 3 of 144 slices shown (2 of 2)]
[im 1/144]
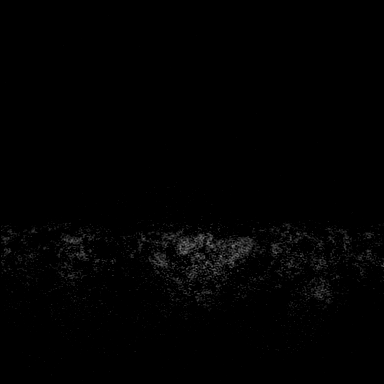
[im 36/144]
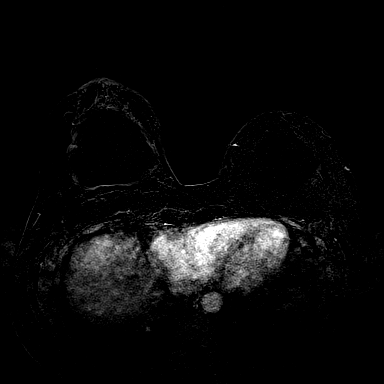
[im 72/144]
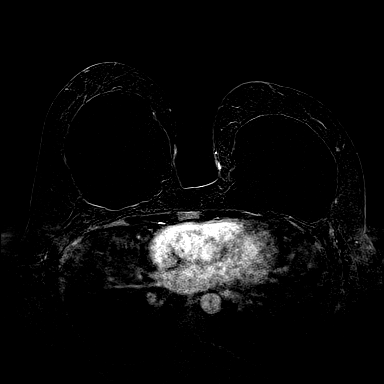

[23 of 48 positions shown; findings below may reference images not displayed]

THREE-DIMENSIONAL MR IMAGE RENDERING ON INDEPENDENT WORKSTATION:

Three-dimensional MR images were rendered by post-processing of the
original MR data on an independent workstation. The
three-dimensional MR images were interpreted, and findings are
reported in the following complete MRI report for this study. Three
dimensional images were evaluated at the independent [employer]
workstation
FINDINGS: Breast composition: b. Scattered fibroglandular tissue.

Background parenchymal enhancement: Minimal

Right breast: Retroglandular saline implant. Postoperative changes
are seen in the lateral portion portion of the right breast,
anterior third, corresponding to the general location of the
mammographic and sonographic abnormalities. Changes of fat necrosis
are identified in this region. There is no suspicious enhancement in
this location. Elsewhere in the right breast, no abnormal
enhancement identified.

Left breast: Retroglandular saline implant. No suspicious
enhancement.

Lymph nodes: No abnormal appearing lymph nodes.

Ancillary findings:  None.
IMPRESSION: 1. No suspicious enhancement in either breast.
2. Fat necrosis changes in the lateral portion of the right breast,
likely accounting for the mammographic abnormality.

RECOMMENDATION:
1. Correlation with recent mammogram/ultrasound findings.
2. Dr. Bados has recommended that the patient return to [REDACTED] for right diagnostic mammogram in 6 months.

BI-RADS CATEGORY  2: Benign.

## 2017-02-24 ENCOUNTER — Telehealth: Payer: Self-pay | Admitting: Allergy and Immunology

## 2017-02-24 MED ORDER — OMEPRAZOLE 40 MG PO CPDR: 40.0000 mg | DELAYED_RELEASE_CAPSULE | Freq: Two times a day (BID) | ORAL | 0 refills | Status: DC

## 2017-02-24 NOTE — Telephone Encounter (Signed)
Sent omeprazole to pharmacy

## 2017-02-24 NOTE — Telephone Encounter (Signed)
Pt called and needs to have omeprazole called into [facility] . [phone].

## 2017-04-20 ENCOUNTER — Encounter: Payer: Self-pay | Admitting: Family Medicine

## 2017-04-20 ENCOUNTER — Ambulatory Visit: Payer: 59 | Admitting: Family Medicine

## 2017-04-20 VITALS — BP 116/68 | HR 70 | Temp 98.1°F | Ht 64.0 in | Wt 162.2 lb

## 2017-04-20 DIAGNOSIS — J4541 Moderate persistent asthma with (acute) exacerbation: Secondary | ICD-10-CM

## 2017-04-20 DIAGNOSIS — D802 Selective deficiency of immunoglobulin A [IgA]: Secondary | ICD-10-CM

## 2017-04-20 DIAGNOSIS — J3089 Other allergic rhinitis: Secondary | ICD-10-CM | POA: Diagnosis not present

## 2017-04-20 DIAGNOSIS — K219 Gastro-esophageal reflux disease without esophagitis: Secondary | ICD-10-CM | POA: Diagnosis not present

## 2017-04-20 MED ORDER — METHYLPREDNISOLONE ACETATE 40 MG/ML IJ SUSP
40.0000 mg | Freq: Once | INTRAMUSCULAR | Status: AC
  Administered 2017-04-20: 40 mg via INTRAMUSCULAR

## 2017-04-20 NOTE — Patient Instructions (Addendum)
1. Moderate persistent asthma with acute exacerbation - DepoMedrol 40 mg IM injection - If not better in 3 days begin prednisone 10 mg for a total of 5 days - Continue Flovent 110 two puffs twice a day with a spacer - Continue montelukast 10 mg daily to prevent cough and wheeze - ProAir 2 puffs every 4 hours as needed or albuterol 0.083% nebulizer solution- 1 vial every 4 hours as needed  2. Allergic rhinitis - Continue allergen avoidance - Continue Nasocort AQ as needed - Continue Zyrtec 10 mg once a day as needed for a runny nose   3. LPRD (laryngopharyngeal reflux disease) - Continue omeprazole 40 mg twice a day  Follow up in 2 months

## 2017-04-20 NOTE — Progress Notes (Signed)
[address] [place] [address] Dept: [phone]  FAMILY NURSE PRACTITIONER FOLLOW UP NOTE  Patient ID: Peggy Wallace, female    DOB: 12/14/1965  Age: 52 y.o. MRN: [id number] Date of Office Visit: 04/20/2017  Assessment  Chief Complaint: Breathing Problem (Pt presents for dry cough with irritation of throat and sinus drainage. Pt states she has had symptoms about 8 days.)  HPI Peggy Wallace is a 52 year old female who presents to the clinic today for a sick visit.  She was last seen in this office on 10/24/2016 by Dr. Nunzio Cobbs for evaluation of persistent asthma and allergic rhinitis with sinusitis.  At that time, she reported some chest tightness and dyspnea that was believed to be caused by grass pollen exposure and heat.  She continued Symbicort 160 2 inhalations twice a day and also required a steroid taper for resolution.  At today's visit, she reports that she experienced a sore throat and body aches that began 7 days ago.  She began to feel shortness of breath, chest tightness, and cough with clear sputum production 6 days ago.  At that time, she began using Flovent 110 -2 puffs twice a day and montelukast 10 mg once daily.  She reports yesterday morning she took 10 mg of prednisolone that she had at her house.  She reports using her rescue albuterol inhaler 3 times yesterday.  Prior to yesterday she had not used her rescue inhaler since before her last visit at this office.  She does report she has an asthma flare 1-2 times a year.  Check rhinitis is reported as moderately well controlled.  She reports clear nasal drainage currently and increased postnasal drip.  She takes Zyrtec 10 mg daily and Nasacort daily which keeps her symptoms moderately well controlled.    Drug Allergies:  Allergies  Allergen Reactions  . Avelox [Moxifloxacin Hcl In Nacl]     c diff -- please do not give any Fluoroquinolones  . Codeine Other (See Comments)    Other REACTION: rash    . Morphine And Related Hives and Itching  . Other Other (See Comments)    c diff -- please do not give any Fluoroquinolones  . Sulfamethoxazole Other (See Comments)    other  . Sulfonamide Derivatives     Hives itching  . Cephalosporins Hives, Itching and Rash    No SOB - tolerates PCN    Physical Exam: BP 116/68 (BP Location: Left Arm, Patient Position: Sitting, Cuff Size: Normal)   Pulse 70   Temp 98.1 F (36.7 C)   Ht 5\' 4"  (1.626 m)   Wt 162 lb 3.2 oz (73.6 kg)   LMP 10/07/2014 (Exact Date)   SpO2 96%   BMI 27.84 kg/m    Physical Exam  Constitutional: She is oriented to person, place, and time. She appears well-developed and well-nourished.  HENT:  Right Ear: External ear normal.  Pharynx erythematous.  Bilateral nares slightly erythematous and edematous.  Ears normal.  Eyes normal.  Eyes: Conjunctivae are normal.  Neck: Normal range of motion. Neck supple.  Cardiovascular: Normal rate, regular rhythm and normal heart sounds.  S1-S2 normal.  Regular rate and rhythm.  No murmur noted.  Pulmonary/Chest: Effort normal and breath sounds normal.  Lungs clear to auscultation.  No wheezes, rhonchi, or crackles noted during exam  Musculoskeletal: Normal range of motion.  Neurological: She is alert and oriented to person, place, and time.  Skin: Skin is warm and dry.  Psychiatric: She  has a normal mood and affect. Her behavior is normal.    Diagnostics: FVC 2.54, FEV1 1.63.  Predicted FVC 3.52 predicted FEV1 2.78.  Spirometry indicates mild restriction and mild airway obstruction.  Post bronchodilator therapy FVC 2.84.  FEV1 1.97.  Significant improvement post bronchodilator therapy.    Assessment and Plan: 1. Moderate persistent asthma with acute exacerbation   2. SELECTIVE IGA IMMUNODEFICIENCY   3. Allergic rhinitis   4. LPRD (laryngopharyngeal reflux disease)     Meds ordered this encounter  Medications  . methylPREDNISolone acetate (DEPO-MEDROL) injection 40 mg     Patient Instructions  1. Moderate persistent asthma with acute exacerbation - DepoMedrol 40 mg IM injection - If not better in 3 days begin prednisone 10 mg for a total of 5 days - Continue Flovent 110 two puffs twice a day with a spacer - Continue montelukast 10 mg daily to prevent cough and wheeze - ProAir 2 puffs every 4 hours as needed or albuterol 0.083% nebulizer solution- 1 vial every 4 hours as needed  2. Allergic rhinitis - Continue allergen avoidance - Continue Nasocort AQ as needed - Continue Zyrtec 10 mg once a day as needed for a runny nose   3. LPRD (laryngopharyngeal reflux disease) - Continue omeprazole 40 mg twice a day  Follow up in 2 months    Return in about 2 months (around 06/18/2017), or if symptoms worsen or fail to improve.    Thank you for the opportunity to care for this patient.  Please do not hesitate to contact me with questions.  Thermon Leyland, FNP [facility]   I reviewed the Nurse Practitioner's note and agree with the documented findings and plan of care. The note in its entirety was edited by myself, including the physical exam, assessment, and plan.   Malachi Bonds, MD [facility]

## 2017-06-20 ENCOUNTER — Ambulatory Visit: Payer: 59 | Admitting: Allergy and Immunology

## 2017-07-12 ENCOUNTER — Ambulatory Visit: Payer: 59 | Admitting: Allergy and Immunology

## 2017-07-25 ENCOUNTER — Ambulatory Visit: Payer: 59 | Admitting: Allergy and Immunology

## 2017-08-01 ENCOUNTER — Ambulatory Visit: Payer: 59 | Admitting: Allergy and Immunology

## 2017-08-04 ENCOUNTER — Telehealth: Payer: Self-pay | Admitting: Internal Medicine

## 2017-08-04 NOTE — Telephone Encounter (Signed)
Pt came in and would like to know if you would except her as a new patient. Her mother id Dot Timlinson and is a patient of yours.  Please advise

## 2017-08-07 NOTE — Telephone Encounter (Signed)
Please let her know I am currently not accepting new patients 

## 2017-08-08 NOTE — Telephone Encounter (Signed)
LVM informing patient.

## 2017-08-22 ENCOUNTER — Ambulatory Visit: Payer: 59 | Admitting: Allergy and Immunology

## 2017-08-22 ENCOUNTER — Encounter: Payer: Self-pay | Admitting: Allergy and Immunology

## 2017-08-22 ENCOUNTER — Other Ambulatory Visit: Payer: Self-pay | Admitting: Allergy and Immunology

## 2017-08-22 ENCOUNTER — Other Ambulatory Visit: Payer: Self-pay | Admitting: Allergy

## 2017-08-22 VITALS — BP 106/76 | HR 66 | Temp 97.9°F | Resp 16 | Ht 64.0 in | Wt 151.0 lb

## 2017-08-22 DIAGNOSIS — J45901 Unspecified asthma with (acute) exacerbation: Secondary | ICD-10-CM

## 2017-08-22 DIAGNOSIS — J0110 Acute frontal sinusitis, unspecified: Secondary | ICD-10-CM | POA: Diagnosis not present

## 2017-08-22 DIAGNOSIS — J3089 Other allergic rhinitis: Secondary | ICD-10-CM

## 2017-08-22 MED ORDER — PREDNISONE 1 MG PO TABS: 10.0000 mg | ORAL_TABLET | Freq: Every day | ORAL | Status: DC

## 2017-08-22 MED ORDER — AMOXICILLIN-POT CLAVULANATE 875-125 MG PO TABS: 1.0000 | ORAL_TABLET | Freq: Two times a day (BID) | ORAL | 0 refills | Status: DC

## 2017-08-22 NOTE — Assessment & Plan Note (Addendum)
   Prednisone has been provided (as above).    A prescription has been provided for Augmentin 875-125 mg twice a day 14 days.  Start budesonide/saline nasal irrigation twice a day.   For thick post nasal drainage, nasal congestion, and/or sinus pressure, add guaifenesin [phone] mg (Mucinex) plus/minus pseudoephedrine 60-120 mg  twice daily as needed with adequate hydration as discussed. Pseudoephedrine is only to be used for short-term relief of nasal/sinus congestion. Long-term use is discouraged due to potential side effects.  The patient has been asked to contact me if her symptoms persist or progress. Otherwise, she may return for follow up in 4 months.

## 2017-08-22 NOTE — Assessment & Plan Note (Signed)
   Continue appropriate allergen avoidance measures.  Treatment plan as outlined above for acute sinusitis. 

## 2017-08-22 NOTE — Patient Instructions (Addendum)
Asthma with acute exacerbation  Prednisone has been provided, 40 mg x3 days, 20 mg x1 day, 10 mg x1 day, then stop.  A sample and prescription have been provided for Symbicort 160/4.5 g, 2 inhalations via spacer device twice a day.  Continue albuterol every 4-6 hours as needed.   The patient has been asked to contact me if her symptoms persist or progress. Otherwise, she may return for follow up in 4 months.  Acute sinusitis  Prednisone has been provided (as above).    A prescription has been provided for Augmentin 875-125 mg twice a day 14 days.  Start budesonide/saline nasal irrigation twice a day.   For thick post nasal drainage, nasal congestion, and/or sinus pressure, add guaifenesin [phone] mg (Mucinex) plus/minus pseudoephedrine 60-120 mg  twice daily as needed with adequate hydration as discussed. Pseudoephedrine is only to be used for short-term relief of nasal/sinus congestion. Long-term use is discouraged due to potential side effects.  The patient has been asked to contact me if her symptoms persist or progress. Otherwise, she may return for follow up in 4 months.  Allergic rhinitis  Continue appropriate allergen avoidance measures.  Treatment plan as outlined above for acute sinusitis.   Return in about 4 months (around 12/23/2017), or if symptoms worsen or fail to improve.  Budesonide (Pulmicort) + Saline Irrigation/Rinse  Budesonide (Pulmicort) is an anti-inflammatory steroid medication used to decrease nasal and sinus inflammation. It is dispensed in liquid form in a vial. Although it is manufactured for use with a nebulizer, we intend for you to use it with the NeilMed Sinus Rinse bottle (preferred) or a Neti pot.   Instructions:  1) Make 240cc of saline in the NeilMed bottle using the salt packets or your own saline recipe (see separate handout).  2) Add the entire 2cc vial of liquid Budesonide (Pulmicort) to the rinse bottle and mix together.  3) While in  the shower or over the sink, tilt your head forward to a comfortable level. Put the tip of the sinus rinse bottle in your nostril and aim it towards the crown or top of your head. Gently squeeze the bottle to flush out your nose. The fluid will circulate in and out of your sinus cavities, coming back out from either nostril or through your mouth. Try not to swallow large quantities and spit it out instead.  4) Perform Budesonide (Pulmicort) + Saline irrigations 2 times daily.
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
## 2017-08-22 NOTE — Assessment & Plan Note (Addendum)
   Prednisone has been provided, 40 mg x3 days, 20 mg x1 day, 10 mg x1 day, then stop.  A sample and prescription have been provided for Symbicort 160/4.5 g, 2 inhalations via spacer device twice a day.  Continue albuterol every 4-6 hours as needed.   The patient has been asked to contact me if her symptoms persist or progress. Otherwise, she may return for follow up in 4 months.

## 2017-08-29 ENCOUNTER — Ambulatory Visit: Payer: 59 | Admitting: Allergy and Immunology

## 2017-08-29 ENCOUNTER — Encounter: Payer: Self-pay | Admitting: Allergy and Immunology

## 2017-08-29 VITALS — BP 120/76 | HR 72 | Temp 97.5°F | Resp 16

## 2017-08-29 DIAGNOSIS — J3089 Other allergic rhinitis: Secondary | ICD-10-CM | POA: Diagnosis not present

## 2017-08-29 DIAGNOSIS — K219 Gastro-esophageal reflux disease without esophagitis: Secondary | ICD-10-CM

## 2017-08-29 DIAGNOSIS — D802 Selective deficiency of immunoglobulin A [IgA]: Secondary | ICD-10-CM | POA: Diagnosis not present

## 2017-08-29 DIAGNOSIS — J4540 Moderate persistent asthma, uncomplicated: Secondary | ICD-10-CM | POA: Diagnosis not present

## 2017-08-29 MED ORDER — ALBUTEROL SULFATE (2.5 MG/3ML) 0.083% IN NEBU: 2.5000 mg | INHALATION_SOLUTION | RESPIRATORY_TRACT | 1 refills | Status: DC | PRN

## 2017-08-29 MED ORDER — METHYLPREDNISOLONE ACETATE 80 MG/ML IJ SUSP
80.0000 mg | Freq: Once | INTRAMUSCULAR | Status: AC
  Administered 2017-08-29: 80 mg via INTRAMUSCULAR

## 2017-08-29 MED ORDER — MONTELUKAST SODIUM 10 MG PO TABS: 10.0000 mg | ORAL_TABLET | Freq: Every day | ORAL | 5 refills | Status: DC

## 2017-08-29 MED ORDER — ALBUTEROL SULFATE 108 (90 BASE) MCG/ACT IN AEPB: 2.0000 | INHALATION_SPRAY | RESPIRATORY_TRACT | 1 refills | Status: DC | PRN

## 2017-08-29 MED ORDER — BUDESONIDE-FORMOTEROL FUMARATE 160-4.5 MCG/ACT IN AERO: 2.0000 | INHALATION_SPRAY | Freq: Two times a day (BID) | RESPIRATORY_TRACT | 5 refills | Status: DC

## 2017-08-29 MED ORDER — IPRATROPIUM BROMIDE 0.06 % NA SOLN: NASAL | 5 refills | Status: DC

## 2017-08-29 MED ORDER — EPINEPHRINE 0.3 MG/0.3ML IJ SOAJ: 0.3000 mg | Freq: Once | INTRAMUSCULAR | 3 refills | Status: AC

## 2017-08-29 NOTE — Patient Instructions (Addendum)
  1. Treat and prevent inflammation:   A. Nasacort 1-2 sprays each nostril one time per day  B. Symbicort 160 - 2 inhalations 1- 2 times per day  C. Montelukast 10mg  1 tablet one time per day  2. If needed:   A. ipratropium 0.06% 2 sprays each nostril every 6 hours to dry up nose  B. nasal saline wash  C. pro-air respiclick or albuterol nebulization  D. over-the-counter antihistamine  E. EpiPen  3. Continue omeprazole 40 mg twice a day and add Ranitidine 150 2 tablets at bedtime during increased reflux problems  4. Rest / ibuprofen /Depo-Medrol 80 IM delivered in clinic today   5. Blood - CBC w/diff, IgA/G/M, anti-pneumococcal 23, antitetanus  6. Return to clinic in 6 months or earlier if problem
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
## 2017-08-30 ENCOUNTER — Encounter: Payer: Self-pay | Admitting: Allergy and Immunology

## 2017-09-04 ENCOUNTER — Encounter: Payer: Self-pay | Admitting: Allergy and Immunology

## 2017-09-05 ENCOUNTER — Encounter: Payer: Self-pay | Admitting: Allergy and Immunology

## 2017-09-05 ENCOUNTER — Ambulatory Visit: Payer: 59 | Admitting: Allergy and Immunology

## 2017-09-05 VITALS — BP 120/70 | HR 70 | Temp 98.0°F | Resp 20

## 2017-09-05 DIAGNOSIS — D802 Selective deficiency of immunoglobulin A [IgA]: Secondary | ICD-10-CM

## 2017-09-05 DIAGNOSIS — J4540 Moderate persistent asthma, uncomplicated: Secondary | ICD-10-CM

## 2017-09-05 DIAGNOSIS — J3089 Other allergic rhinitis: Secondary | ICD-10-CM

## 2017-09-05 DIAGNOSIS — K219 Gastro-esophageal reflux disease without esophagitis: Secondary | ICD-10-CM

## 2017-09-05 LAB — CBC WITH DIFFERENTIAL/PLATELET
Basophils Absolute: 0 10*3/uL (ref 0.0–0.2)
Basos: 0 %
EOS (ABSOLUTE): 0 10*3/uL (ref 0.0–0.4)
Eos: 1 %
Hematocrit: 41.6 % (ref 34.0–46.6)
Hemoglobin: 13.7 g/dL (ref 11.1–15.9)
Immature Grans (Abs): 0 10*3/uL (ref 0.0–0.1)
Immature Granulocytes: 0 %
Lymphocytes Absolute: 1.2 10*3/uL (ref 0.7–3.1)
Lymphs: 17 %
MCH: 29.3 pg (ref 26.6–33.0)
MCHC: 32.9 g/dL (ref 31.5–35.7)
MCV: 89 fL (ref 79–97)
Monocytes Absolute: 0.2 10*3/uL (ref 0.1–0.9)
Monocytes: 3 %
Neutrophils Absolute: 5.6 10*3/uL (ref 1.4–7.0)
Neutrophils: 79 %
Platelets: 235 10*3/uL (ref 150–450)
RBC: 4.68 x10E6/uL (ref 3.77–5.28)
RDW: 14.2 % (ref 12.3–15.4)
WBC: 7.1 10*3/uL (ref 3.4–10.8)

## 2017-09-05 LAB — STREP PNEUMONIAE 23 SEROTYPES IGG
Pneumo Ab Type 1*: 2.1 ug/mL (ref >1.3)
Pneumo Ab Type 12 (12F)*: 0.3 ug/mL — ABNORMAL LOW (ref >1.3)
Pneumo Ab Type 14*: 0.2 ug/mL — ABNORMAL LOW (ref >1.3)
Pneumo Ab Type 17 (17F)*: 1.2 ug/mL — ABNORMAL LOW (ref >1.3)
Pneumo Ab Type 19 (19F)*: 0.6 ug/mL — ABNORMAL LOW (ref >1.3)
Pneumo Ab Type 2*: 6.4 ug/mL (ref >1.3)
Pneumo Ab Type 20*: 1.1 ug/mL — ABNORMAL LOW (ref >1.3)
Pneumo Ab Type 22 (22F)*: 0.3 ug/mL — ABNORMAL LOW (ref >1.3)
Pneumo Ab Type 23 (23F)*: 1.3 ug/mL — ABNORMAL LOW (ref >1.3)
Pneumo Ab Type 26 (6B)*: 4 ug/mL (ref >1.3)
Pneumo Ab Type 3*: 3.6 ug/mL (ref >1.3)
Pneumo Ab Type 34 (10A)*: 1 ug/mL — ABNORMAL LOW (ref >1.3)
Pneumo Ab Type 4*: 0.6 ug/mL — ABNORMAL LOW (ref >1.3)
Pneumo Ab Type 43 (11A)*: 0.8 ug/mL — ABNORMAL LOW (ref >1.3)
Pneumo Ab Type 5*: 2 ug/mL (ref >1.3)
Pneumo Ab Type 51 (7F)*: 1 ug/mL — ABNORMAL LOW (ref >1.3)
Pneumo Ab Type 54 (15B)*: 1.1 ug/mL — ABNORMAL LOW (ref >1.3)
Pneumo Ab Type 56 (18C)*: 10.4 ug/mL (ref >1.3)
Pneumo Ab Type 57 (19A)*: 5.1 ug/mL (ref >1.3)
Pneumo Ab Type 68 (9V)*: 1.2 ug/mL — ABNORMAL LOW (ref >1.3)
Pneumo Ab Type 70 (33F)*: 1.6 ug/mL (ref >1.3)
Pneumo Ab Type 8*: 10.8 ug/mL (ref >1.3)
Pneumo Ab Type 9 (9N)*: 4.5 ug/mL (ref >1.3)

## 2017-09-05 LAB — IGG, IGA, IGM
IgA/Immunoglobulin A, Serum: <5 mg/dL — ABNORMAL LOW (ref 87–352)
IgG (Immunoglobin G), Serum: 1744 mg/dL — ABNORMAL HIGH (ref 700–1600)
IgM (Immunoglobulin M), Srm: 140 mg/dL (ref 26–217)

## 2017-09-05 LAB — TETANUS ANTIBODY, IGG: Tetanus Ab, IgG: 5.85 IU/mL (ref <0.10)

## 2017-09-05 MED ORDER — FLUTICASONE PROPIONATE HFA 110 MCG/ACT IN AERO: 2.0000 | INHALATION_SPRAY | Freq: Two times a day (BID) | RESPIRATORY_TRACT | 2 refills | Status: DC

## 2017-09-05 MED ORDER — HYDROCOD POLST-CPM POLST ER 10-8 MG/5ML PO SUER: ORAL | 0 refills | Status: DC

## 2017-09-05 NOTE — Patient Instructions (Addendum)
  1. Continue to Treat and prevent inflammation:   A. Nasacort 1-2 sprays each nostril one time per day  B. Symbicort 160 - 2 inhalations 1- 2 times per day  C. Montelukast 10mg  1 tablet one time per day  2. If needed:   A. ipratropium 0.06% 2 sprays each nostril every 6 hours to dry up nose  B. nasal saline wash  C. pro-air respiclick or albuterol nebulization  D. over-the-counter antihistamine  E. EpiPen  3. Continue omeprazole 40 mg twice a day and add Ranitidine 150 2 tablets at bedtime during increased reflux problems  4. Add Flovent 110 -2 inhalations twice a day to Symbicort during "flareup"  5.  Can use Tussionex -2-5 mL's every 12 hours as needed for cough, 60 mL prescription without refill.  Narcotic warning.  6. When better, this summer, obtain Pneumovax vaccination.  7. Return to clinic in 6 months or earlier if problem
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
## 2017-09-06 ENCOUNTER — Encounter: Payer: Self-pay | Admitting: Allergy and Immunology

## 2017-09-20 ENCOUNTER — Other Ambulatory Visit: Payer: Self-pay

## 2017-09-20 ENCOUNTER — Encounter: Payer: Self-pay | Admitting: Allergy and Immunology

## 2017-09-20 MED ORDER — PREDNISONE 10 MG PO TABS: ORAL_TABLET | ORAL | 0 refills | Status: DC

## 2017-09-20 MED ORDER — AZITHROMYCIN 500 MG PO TABS: ORAL_TABLET | ORAL | 0 refills | Status: DC

## 2017-10-05 ENCOUNTER — Ambulatory Visit (INDEPENDENT_AMBULATORY_CARE_PROVIDER_SITE_OTHER): Payer: 59 | Admitting: Orthopaedic Surgery

## 2017-10-16 ENCOUNTER — Encounter: Payer: Self-pay | Admitting: Allergy and Immunology

## 2017-10-16 ENCOUNTER — Encounter (INDEPENDENT_AMBULATORY_CARE_PROVIDER_SITE_OTHER): Payer: Self-pay | Admitting: Orthopedic Surgery

## 2017-10-16 ENCOUNTER — Ambulatory Visit (INDEPENDENT_AMBULATORY_CARE_PROVIDER_SITE_OTHER): Payer: 59 | Admitting: Orthopedic Surgery

## 2017-10-16 DIAGNOSIS — S60212A Contusion of left wrist, initial encounter: Secondary | ICD-10-CM | POA: Diagnosis not present

## 2017-10-16 DIAGNOSIS — G8929 Other chronic pain: Secondary | ICD-10-CM | POA: Diagnosis not present

## 2017-10-16 DIAGNOSIS — G5631 Lesion of radial nerve, right upper limb: Secondary | ICD-10-CM

## 2017-10-16 DIAGNOSIS — M546 Pain in thoracic spine: Secondary | ICD-10-CM
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
## 2017-10-17 ENCOUNTER — Ambulatory Visit (INDEPENDENT_AMBULATORY_CARE_PROVIDER_SITE_OTHER): Payer: 59 | Admitting: Allergy and Immunology

## 2017-10-17 ENCOUNTER — Encounter: Payer: Self-pay | Admitting: Allergy and Immunology

## 2017-10-17 ENCOUNTER — Telehealth: Payer: Self-pay | Admitting: *Deleted

## 2017-10-17 ENCOUNTER — Ambulatory Visit
Admission: RE | Admit: 2017-10-17 | Discharge: 2017-10-17 | Disposition: A | Discharge status: Home / Self Care | Payer: 59 | Source: Ambulatory Visit | Attending: Allergy and Immunology | Admitting: Allergy and Immunology

## 2017-10-17 VITALS — BP 116/82 | HR 80 | Resp 20

## 2017-10-17 DIAGNOSIS — R05 Cough: Secondary | ICD-10-CM

## 2017-10-17 DIAGNOSIS — K219 Gastro-esophageal reflux disease without esophagitis: Secondary | ICD-10-CM

## 2017-10-17 DIAGNOSIS — R059 Cough, unspecified: Secondary | ICD-10-CM

## 2017-10-17 DIAGNOSIS — J4551 Severe persistent asthma with (acute) exacerbation: Secondary | ICD-10-CM

## 2017-10-17 DIAGNOSIS — D802 Selective deficiency of immunoglobulin A [IgA]: Secondary | ICD-10-CM | POA: Diagnosis not present

## 2017-10-17 DIAGNOSIS — J3089 Other allergic rhinitis: Secondary | ICD-10-CM | POA: Diagnosis not present

## 2017-10-17 NOTE — Progress Notes (Signed)
Immunotherapy   Patient Details  Name: Peggy Wallace MRN: [id number] Date of Birth: 1965-09-07  10/17/2017  Dois Davenport started injections for  Dupixent 600mg   Frequency:Every 2 weeks 300 mg Epi-Pen:Epi-Pen Available  Consent signed and patient instructions given. Given 300 mg left arm and patient gave herself 300 mg right thigh. Waited 15 min no problems.  10/17/2017, 4:42 PM
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
## 2017-10-17 NOTE — Telephone Encounter (Signed)
Dr Lucie Leather started patient on Dupixent please submit for medication.sample given today

## 2017-10-17 NOTE — Patient Instructions (Addendum)
  1. Continue to Treat and prevent inflammation:   A. Nasacort 1-2 sprays each nostril one time per day  B. Symbicort 160 - 2 inhalations 1- 2 times per day  C. Flovent 110 -2 inhalations twice a day   D. Montelukast 10mg  1 tablet one time per day  E. Start Dupilumab injections today and every 2 weeks  F. Continue on 10mg  prednisone daily  2. If needed:   A. ipratropium 0.06% 2 sprays each nostril every 6 hours to dry up nose  B. nasal saline wash  C. pro-air respiclick or albuterol nebulization  D. over-the-counter antihistamine  E. EpiPen  3. Continue omeprazole 40 mg twice a day and add Ranitidine 150 2 tablets at bedtime during increased reflux problems  4. Return to clinic in 2 weeks   5. When better, this summer, obtain Pneumovax vaccination.

## 2017-10-18 ENCOUNTER — Encounter: Payer: Self-pay | Admitting: Allergy and Immunology

## 2017-10-20 ENCOUNTER — Telehealth: Payer: Self-pay | Admitting: Allergy and Immunology

## 2017-10-20 MED ORDER — PREDNISONE 10 MG PO TABS: 10.0000 mg | ORAL_TABLET | Freq: Every day | ORAL | 0 refills | Status: DC

## 2017-10-20 NOTE — Telephone Encounter (Signed)
Per Dr. Kathyrn Lass discharge instructions patient is to continue prednisone 10 mg po qd until seen in office. I am sending refill for above med to [facility]. Patient is aware.

## 2017-10-20 NOTE — Telephone Encounter (Signed)
Patient is calling Was recently seen and placed on prednisone Patient feels that she is to be on prednisone until she is seen again in 3 weeks Patient will run out of prednisone 10mg  on Monday She can come by and get some, or a script can be sent IF she is to stay on the medication  Please call patient to answer her questions

## 2017-10-23 NOTE — Telephone Encounter (Signed)
Got it.

## 2017-10-27 ENCOUNTER — Other Ambulatory Visit: Payer: Self-pay | Admitting: Allergy and Immunology

## 2017-11-07 ENCOUNTER — Ambulatory Visit: Payer: 59 | Admitting: Allergy and Immunology

## 2017-11-07 ENCOUNTER — Encounter: Payer: Self-pay | Admitting: Allergy and Immunology

## 2017-11-07 VITALS — BP 92/68 | HR 72 | Resp 18

## 2017-11-07 DIAGNOSIS — J3089 Other allergic rhinitis: Secondary | ICD-10-CM | POA: Diagnosis not present

## 2017-11-07 DIAGNOSIS — K219 Gastro-esophageal reflux disease without esophagitis: Secondary | ICD-10-CM

## 2017-11-07 DIAGNOSIS — J4550 Severe persistent asthma, uncomplicated: Secondary | ICD-10-CM

## 2017-11-07 DIAGNOSIS — D698 Other specified hemorrhagic conditions: Secondary | ICD-10-CM

## 2017-11-07 DIAGNOSIS — D802 Selective deficiency of immunoglobulin A [IgA]: Secondary | ICD-10-CM | POA: Diagnosis not present

## 2017-11-07 DIAGNOSIS — Z91038 Other insect allergy status: Secondary | ICD-10-CM

## 2017-11-07 DIAGNOSIS — Z91030 Bee allergy status: Secondary | ICD-10-CM

## 2017-11-07 DIAGNOSIS — K208 Other esophagitis without bleeding: Secondary | ICD-10-CM

## 2017-11-07 DIAGNOSIS — B49 Unspecified mycosis: Secondary | ICD-10-CM

## 2017-11-07 MED ORDER — FLUCONAZOLE 150 MG PO TABS: ORAL_TABLET | ORAL | 0 refills | Status: DC

## 2017-11-07 MED ORDER — RANITIDINE HCL 300 MG PO TABS: 300.0000 mg | ORAL_TABLET | Freq: Every day | ORAL | 5 refills | Status: DC

## 2017-11-07 MED ORDER — AUVI-Q 0.3 MG/0.3ML IJ SOAJ: INTRAMUSCULAR | 1 refills | Status: DC
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
## 2017-11-07 NOTE — Patient Instructions (Signed)
  1. Continue to Treat and prevent inflammation:   A. Nasacort 1-2 sprays each nostril one time per day  B. Symbicort 160 - 2 inhalations 1- 2 times per day  C. Flovent 110 -2 inhalations twice a day   D. Montelukast 10mg  1 tablet one time per day  E. Dupilumab injections today and every 2 weeks  2. Continue to treat and prevent reflux:   A.  Generic Zegerid 40 mg twice a day  B.  Ranitidine 300 mg in evening  3.  Treat fungus:   A.  Diflucan 150 - 1 tablet today and repeat dose Sunday  4. If needed:   A. ipratropium 0.06% 2 sprays each nostril every 6 hours to dry up nose  B. nasal saline wash  C. pro-air respiclick or albuterol nebulization  D. over-the-counter antihistamine  E. EpiPen  5.  Taper prednisone: 10 mg alternating with 5 mg every other day for 10 days, then 5 mg every day for 10 days, then 5 mg every other day for 10 days, then discontinue  6.  Further evaluation for skin issue?  7.  Return to clinic in 4 weeks or earlier if problem  8. When better, this summer, obtain Pneumovax vaccination.

## 2017-11-08 ENCOUNTER — Encounter: Payer: Self-pay | Admitting: Allergy and Immunology

## 2017-11-14 ENCOUNTER — Encounter: Payer: Self-pay | Admitting: Allergy and Immunology

## 2017-11-22 ENCOUNTER — Encounter (INDEPENDENT_AMBULATORY_CARE_PROVIDER_SITE_OTHER): Payer: Self-pay | Admitting: Family Medicine

## 2017-11-22 DIAGNOSIS — M3500 Sicca syndrome, unspecified: Secondary | ICD-10-CM | POA: Insufficient documentation

## 2017-11-23 ENCOUNTER — Encounter (INDEPENDENT_AMBULATORY_CARE_PROVIDER_SITE_OTHER): Payer: Self-pay | Admitting: Family Medicine

## 2017-11-23 ENCOUNTER — Ambulatory Visit (INDEPENDENT_AMBULATORY_CARE_PROVIDER_SITE_OTHER): Payer: 59 | Admitting: Family Medicine

## 2017-11-23 VITALS — BP 99/61 | HR 67 | Temp 97.4°F | Resp 14 | Ht 64.0 in | Wt 147.0 lb

## 2017-11-23 DIAGNOSIS — E785 Hyperlipidemia, unspecified: Secondary | ICD-10-CM

## 2017-11-23 DIAGNOSIS — J4541 Moderate persistent asthma with (acute) exacerbation: Secondary | ICD-10-CM | POA: Diagnosis not present

## 2017-11-23 DIAGNOSIS — K219 Gastro-esophageal reflux disease without esophagitis: Secondary | ICD-10-CM | POA: Diagnosis not present

## 2017-11-23 DIAGNOSIS — K582 Mixed irritable bowel syndrome: Secondary | ICD-10-CM | POA: Diagnosis not present

## 2017-11-23 DIAGNOSIS — Z0000 Encounter for general adult medical examination without abnormal findings: Secondary | ICD-10-CM

## 2017-11-23 MED ORDER — DILTIAZEM HCL 60 MG PO TABS: 60.0000 mg | ORAL_TABLET | Freq: Four times a day (QID) | ORAL | 11 refills | Status: DC | PRN
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
## 2017-11-24 ENCOUNTER — Telehealth (INDEPENDENT_AMBULATORY_CARE_PROVIDER_SITE_OTHER): Payer: Self-pay | Admitting: Family Medicine

## 2017-11-24 LAB — CBC WITH DIFFERENTIAL/PLATELET
Basophils Absolute: 20 cells/uL (ref 0–200)
Basophils Relative: 0.3 %
Eosinophils Absolute: 68 cells/uL (ref 15–500)
Eosinophils Relative: 1 %
HCT: 41.8 % (ref 35.0–45.0)
Hemoglobin: 14.1 g/dL (ref 11.7–15.5)
Lymphs Abs: 1836 cells/uL (ref 850–3900)
MCH: 29.6 pg (ref 27.0–33.0)
MCHC: 33.7 g/dL (ref 32.0–36.0)
MCV: 87.8 fL (ref 80.0–100.0)
MPV: 11.1 fL (ref 7.5–12.5)
Monocytes Relative: 8.6 %
Neutro Abs: 4291 cells/uL (ref 1500–7800)
Neutrophils Relative %: 63.1 %
Platelets: 229 10*3/uL (ref 140–400)
RBC: 4.76 10*6/uL (ref 3.80–5.10)
RDW: 12.5 % (ref 11.0–15.0)
Total Lymphocyte: 27 %
WBC mixed population: 585 cells/uL (ref 200–950)
WBC: 6.8 10*3/uL (ref 3.8–10.8)

## 2017-11-24 LAB — COMPREHENSIVE METABOLIC PANEL
AG Ratio: 1.7 (calc) (ref 1.0–2.5)
ALT: 18 U/L (ref 6–29)
AST: 17 U/L (ref 10–35)
Albumin: 4.5 g/dL (ref 3.6–5.1)
Alkaline phosphatase (APISO): 42 U/L (ref 33–130)
BUN: 17 mg/dL (ref 7–25)
CO2: 26 mmol/L (ref 20–32)
Calcium: 9.1 mg/dL (ref 8.6–10.4)
Chloride: 103 mmol/L (ref 98–110)
Creat: 0.8 mg/dL (ref 0.50–1.05)
Globulin: 2.7 g/dL (calc) (ref 1.9–3.7)
Glucose, Bld: 72 mg/dL (ref 65–99)
Potassium: 3.9 mmol/L (ref 3.5–5.3)
Sodium: 142 mmol/L (ref 135–146)
Total Bilirubin: 0.3 mg/dL (ref 0.2–1.2)
Total Protein: 7.2 g/dL (ref 6.1–8.1)

## 2017-11-24 LAB — LIPID PANEL
Cholesterol: 183 mg/dL (ref <200)
HDL: 64 mg/dL (ref >50)
LDL Cholesterol (Calc): 109 mg/dL (calc) — ABNORMAL HIGH
Non-HDL Cholesterol (Calc): 119 mg/dL (calc) (ref <130)
Total CHOL/HDL Ratio: 2.9 (calc) (ref <5.0)
Triglycerides: 34 mg/dL (ref <150)

## 2017-11-24 LAB — VITAMIN D 25 HYDROXY (VIT D DEFICIENCY, FRACTURES): Vit D, 25-Hydroxy: 69 ng/mL (ref 30–100)

## 2017-11-24 LAB — TIQ-MISC

## 2017-11-24 LAB — HIGH SENSITIVITY CRP: hs-CRP: 0.6 mg/L

## 2017-11-24 MED ORDER — ZOSTER VAC RECOMB ADJUVANTED 50 MCG/0.5ML IM SUSR: 0.5000 mL | Freq: Once | INTRAMUSCULAR | 0 refills | Status: AC

## 2017-11-24 NOTE — Telephone Encounter (Signed)
Labs look great!  [facility]

## 2017-11-24 NOTE — Addendum Note (Signed)
Addended by: Lillia Carmel on: 11/24/2017 10:24 AM   Modules accepted: Orders

## 2017-12-05 ENCOUNTER — Encounter: Payer: Self-pay | Admitting: Allergy and Immunology

## 2017-12-05 ENCOUNTER — Ambulatory Visit: Payer: 59 | Admitting: Allergy and Immunology

## 2017-12-05 VITALS — BP 110/82 | HR 64 | Resp 16

## 2017-12-05 DIAGNOSIS — J4550 Severe persistent asthma, uncomplicated: Secondary | ICD-10-CM

## 2017-12-05 DIAGNOSIS — J3089 Other allergic rhinitis: Secondary | ICD-10-CM | POA: Diagnosis not present

## 2017-12-05 DIAGNOSIS — Z91038 Other insect allergy status: Secondary | ICD-10-CM

## 2017-12-05 DIAGNOSIS — K219 Gastro-esophageal reflux disease without esophagitis: Secondary | ICD-10-CM

## 2017-12-05 DIAGNOSIS — K208 Other esophagitis without bleeding: Secondary | ICD-10-CM

## 2017-12-05 DIAGNOSIS — Z91030 Bee allergy status: Secondary | ICD-10-CM

## 2017-12-05 DIAGNOSIS — D802 Selective deficiency of immunoglobulin A [IgA]: Secondary | ICD-10-CM
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
## 2017-12-05 NOTE — Patient Instructions (Addendum)
  1. Continue to Treat and prevent inflammation:   A. Nasacort 1-2 sprays each nostril one time per day  B. Symbicort 160 - 2 inhalations 1- 2 times per day  C. Flovent 110 -2 inhalations twice a day   D. Montelukast 10mg  1 tablet one time per day  E. Dupilumab injections every 2 weeks  2. Continue to treat and prevent reflux:   A.  Generic Zegerid 40 mg twice a day  B.  Ranitidine 300 mg in evening  3.  If needed:   A. ipratropium 0.06% 2 sprays each nostril every 6 hours to dry up nose  B. nasal saline wash  C. pro-air respiclick or albuterol nebulization  D. over-the-counter antihistamine  E. EpiPen  5.  Decrease Flovent to 1 time per day in 2 weeks and then discontinue Flovent in 4 weeks if continued improvement.  6. When better, this summer, obtain Pneumovax vaccination.  7.  Adrenal dysfunction?  Stress steroids?  8.  Return to clinic November 2019 or earlier if problem

## 2017-12-06 ENCOUNTER — Encounter: Payer: Self-pay | Admitting: Allergy and Immunology

## 2017-12-06 MED ORDER — ALBUTEROL SULFATE 108 (90 BASE) MCG/ACT IN AEPB: 2.0000 | INHALATION_SPRAY | RESPIRATORY_TRACT | 1 refills | Status: DC | PRN

## 2017-12-06 MED ORDER — MONTELUKAST SODIUM 10 MG PO TABS: 10.0000 mg | ORAL_TABLET | Freq: Every day | ORAL | 5 refills | Status: DC

## 2017-12-06 MED ORDER — ALBUTEROL SULFATE (2.5 MG/3ML) 0.083% IN NEBU: 2.5000 mg | INHALATION_SOLUTION | RESPIRATORY_TRACT | 1 refills | Status: DC | PRN

## 2017-12-06 MED ORDER — FLUTICASONE PROPIONATE HFA 110 MCG/ACT IN AERO: INHALATION_SPRAY | RESPIRATORY_TRACT | 2 refills | Status: DC

## 2017-12-06 MED ORDER — BUDESONIDE-FORMOTEROL FUMARATE 160-4.5 MCG/ACT IN AERO: 2.0000 | INHALATION_SPRAY | Freq: Two times a day (BID) | RESPIRATORY_TRACT | 5 refills | Status: DC

## 2017-12-06 MED ORDER — IPRATROPIUM BROMIDE 0.06 % NA SOLN: NASAL | 5 refills | Status: DC

## 2017-12-06 MED ORDER — RANITIDINE HCL 300 MG PO TABS: 300.0000 mg | ORAL_TABLET | Freq: Every day | ORAL | 5 refills | Status: DC

## 2017-12-06 MED ORDER — AUVI-Q 0.3 MG/0.3ML IJ SOAJ: INTRAMUSCULAR | 1 refills | Status: DC

## 2017-12-06 NOTE — Addendum Note (Signed)
Addended by: Florence Canner on: 12/06/2017 05:02 PM   Modules accepted: Orders

## 2017-12-26 ENCOUNTER — Encounter (INDEPENDENT_AMBULATORY_CARE_PROVIDER_SITE_OTHER): Payer: Self-pay | Admitting: Family Medicine

## 2017-12-26 ENCOUNTER — Ambulatory Visit (INDEPENDENT_AMBULATORY_CARE_PROVIDER_SITE_OTHER): Payer: 59 | Admitting: Family Medicine

## 2017-12-26 DIAGNOSIS — M546 Pain in thoracic spine: Secondary | ICD-10-CM

## 2017-12-26 MED ORDER — TIZANIDINE HCL 2 MG PO TABS: 2.0000 mg | ORAL_TABLET | Freq: Four times a day (QID) | ORAL | 1 refills | Status: DC | PRN
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
## 2018-01-04 ENCOUNTER — Telehealth: Payer: Self-pay | Admitting: *Deleted

## 2018-01-04 ENCOUNTER — Telehealth: Payer: Self-pay | Admitting: Allergy and Immunology

## 2018-01-04 ENCOUNTER — Ambulatory Visit (INDEPENDENT_AMBULATORY_CARE_PROVIDER_SITE_OTHER): Payer: 59 | Admitting: *Deleted

## 2018-01-04 DIAGNOSIS — Z23 Encounter for immunization: Secondary | ICD-10-CM | POA: Diagnosis not present

## 2018-01-04 MED ORDER — PNEUMOCOCCAL VAC POLYVALENT 25 MCG/0.5ML IJ INJ
0.5000 mL | INJECTION | INTRAMUSCULAR | Status: AC
  Administered 2018-01-04: 0.5 mL via INTRAMUSCULAR

## 2018-01-04 NOTE — Telephone Encounter (Signed)
Patient received Pneumovax today. She will come back next week to get flu shot.

## 2018-01-04 NOTE — Telephone Encounter (Signed)
Patient is coming in for her Pneumovax today. She would like [place] to give her a flu shot is that okay? I told her she must wait 48 hours after getting pneumovax to get her flu shot. Please advise.

## 2018-01-04 NOTE — Telephone Encounter (Signed)
She can receive both vaccinations at the same time.

## 2018-01-04 NOTE — Telephone Encounter (Signed)
Pt called and wanted to let you know that she stop taking Ranitidine 300 on Friday and she is not feeling sick.

## 2018-01-25 ENCOUNTER — Other Ambulatory Visit: Payer: Self-pay | Admitting: *Deleted

## 2018-01-25 ENCOUNTER — Other Ambulatory Visit: Payer: Self-pay | Admitting: Allergy and Immunology

## 2018-01-25 MED ORDER — SODIUM BICARBONATE 650 MG PO TABS: ORAL_TABLET | ORAL | 3 refills | Status: DC

## 2018-02-27 ENCOUNTER — Ambulatory Visit: Payer: 59 | Admitting: Allergy and Immunology

## 2018-02-27 ENCOUNTER — Encounter: Payer: Self-pay | Admitting: Allergy and Immunology

## 2018-02-27 VITALS — BP 108/66 | HR 68 | Resp 16

## 2018-02-27 DIAGNOSIS — J4550 Severe persistent asthma, uncomplicated: Secondary | ICD-10-CM

## 2018-02-27 DIAGNOSIS — K219 Gastro-esophageal reflux disease without esophagitis: Secondary | ICD-10-CM | POA: Diagnosis not present

## 2018-02-27 DIAGNOSIS — J3089 Other allergic rhinitis: Secondary | ICD-10-CM

## 2018-02-27 DIAGNOSIS — Z91038 Other insect allergy status: Secondary | ICD-10-CM

## 2018-02-27 DIAGNOSIS — D802 Selective deficiency of immunoglobulin A [IgA]: Secondary | ICD-10-CM

## 2018-02-27 DIAGNOSIS — Z91030 Bee allergy status: Secondary | ICD-10-CM
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
## 2018-02-27 NOTE — Patient Instructions (Signed)
  1. Continue to Treat and prevent inflammation:   A. Nasacort 1-2 sprays each nostril one time per day  B. Symbicort 160 - 2 inhalations 1- 2 times per day  C. Montelukast 10mg  1 tablet one time per day  E. Dupilumab injections every 2 weeks  2. Continue to treat and prevent reflux:   A.  Generic Zegerid 40 mg twice a day  3.  If needed:   A. ipratropium 0.06% 2 sprays each nostril every 6 hours to dry up nose  B. nasal saline wash  C. pro-air respiclick or albuterol nebulization  D. over-the-counter antihistamine  E. EpiPen  4.  Add Flovent 110 - 2 inhalations 2 times a day to Symbicort during 'Flare up"  5. Return to clinic 6 months or earlier if problem

## 2018-02-28 ENCOUNTER — Encounter: Payer: Self-pay | Admitting: Allergy and Immunology

## 2018-03-02 ENCOUNTER — Other Ambulatory Visit: Payer: Self-pay | Admitting: Allergy and Immunology

## 2018-03-14 ENCOUNTER — Encounter (INDEPENDENT_AMBULATORY_CARE_PROVIDER_SITE_OTHER): Payer: Self-pay | Admitting: Orthopedic Surgery

## 2018-03-14 ENCOUNTER — Ambulatory Visit (INDEPENDENT_AMBULATORY_CARE_PROVIDER_SITE_OTHER): Payer: Self-pay

## 2018-03-14 ENCOUNTER — Ambulatory Visit (INDEPENDENT_AMBULATORY_CARE_PROVIDER_SITE_OTHER): Payer: 59 | Admitting: Orthopedic Surgery

## 2018-03-14 DIAGNOSIS — M25511 Pain in right shoulder: Secondary | ICD-10-CM | POA: Diagnosis not present

## 2018-03-14 DIAGNOSIS — M25552 Pain in left hip: Secondary | ICD-10-CM | POA: Diagnosis not present
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
## 2018-03-14 NOTE — Addendum Note (Signed)
Addended by: Henrine Screws L on: 03/14/2018 01:27 PM   Modules accepted: Orders

## 2018-03-16 ENCOUNTER — Other Ambulatory Visit: Payer: 59

## 2018-03-19 ENCOUNTER — Encounter (INDEPENDENT_AMBULATORY_CARE_PROVIDER_SITE_OTHER): Payer: Self-pay | Admitting: Orthopedic Surgery

## 2018-03-19 ENCOUNTER — Telehealth (INDEPENDENT_AMBULATORY_CARE_PROVIDER_SITE_OTHER): Payer: Self-pay | Admitting: Orthopedic Surgery

## 2018-03-19 NOTE — Telephone Encounter (Signed)
FYI

## 2018-03-19 NOTE — Telephone Encounter (Signed)
I can call with results pls call thx

## 2018-03-19 NOTE — Telephone Encounter (Signed)
Yes. I can call her.

## 2018-03-19 NOTE — Telephone Encounter (Signed)
Patient has an MRI scheduled for Wednesday, December 11th and called to make an MRI review appointment.  I advised the patient that Dr. Diamantina Providence next available is January 2.  She stated that she is in a lot of pain and could not wait until then.  Patient wanted to know if Dr. August Saucer would be willing to call her with the results.  CB#[phone].  Thank you.

## 2018-03-20 ENCOUNTER — Ambulatory Visit
Admission: RE | Admit: 2018-03-20 | Discharge: 2018-03-20 | Disposition: A | Discharge status: Home / Self Care | Payer: 59 | Source: Ambulatory Visit | Attending: Orthopedic Surgery | Admitting: Orthopedic Surgery

## 2018-03-20 ENCOUNTER — Encounter: Payer: Self-pay | Admitting: Allergy and Immunology

## 2018-03-20 DIAGNOSIS — M25552 Pain in left hip: Secondary | ICD-10-CM

## 2018-03-20 NOTE — Telephone Encounter (Signed)
Would you mind letting her know to just call us after she has her MRI and he said he would call her with results

## 2018-03-21 ENCOUNTER — Other Ambulatory Visit: Payer: 59

## 2018-03-22 ENCOUNTER — Telehealth (INDEPENDENT_AMBULATORY_CARE_PROVIDER_SITE_OTHER): Payer: Self-pay

## 2018-03-22 ENCOUNTER — Encounter (INDEPENDENT_AMBULATORY_CARE_PROVIDER_SITE_OTHER): Payer: Self-pay | Admitting: Family Medicine

## 2018-03-22 NOTE — Telephone Encounter (Signed)
I called to schedule an US - guided left injection (20 minutes) with Dr. Prince Rome per his order. Reached voice mail.  Left instructions for patient to call and anyone from the front office can schedule this appointment for next week (Dr Prince Rome is out of the office the rest of this week). Will hold this message for now to make sure this gets scheduled.

## 2018-03-22 NOTE — Telephone Encounter (Signed)
Appointment scheduled for 03/27/18 at 11:00.

## 2018-03-26 ENCOUNTER — Telehealth: Payer: Self-pay

## 2018-03-26 DIAGNOSIS — J4540 Moderate persistent asthma, uncomplicated: Secondary | ICD-10-CM

## 2018-03-26 NOTE — Telephone Encounter (Signed)
-----   Message from Jessica Priest, MD sent at 03/26/2018  8:23 AM EST ----- Please note that patient will drop by clinic this week to have blood for alpha-1antitrypsin level and phenotype with diagnosis asthma.

## 2018-03-26 NOTE — Telephone Encounter (Signed)
I called shot am

## 2018-03-26 NOTE — Telephone Encounter (Signed)
Labs have been ordered and given to lab tech. 

## 2018-03-27 ENCOUNTER — Ambulatory Visit (INDEPENDENT_AMBULATORY_CARE_PROVIDER_SITE_OTHER): Payer: 59 | Admitting: Family Medicine

## 2018-03-27 ENCOUNTER — Encounter (INDEPENDENT_AMBULATORY_CARE_PROVIDER_SITE_OTHER): Payer: Self-pay | Admitting: Family Medicine

## 2018-03-27 DIAGNOSIS — M25552 Pain in left hip: Secondary | ICD-10-CM

## 2018-03-27 MED ORDER — METHYLPREDNISOLONE ACETATE 40 MG/ML IJ SUSP
40.0000 mg | INTRAMUSCULAR | Status: AC | PRN
  Administered 2018-03-27: 40 mg via INTRA_ARTICULAR

## 2018-03-27 MED ORDER — LIDOCAINE HCL (PF) 1 % IJ SOLN
5.0000 mL | INTRAMUSCULAR | Status: AC | PRN
  Administered 2018-03-27: 5 mL
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
## 2018-04-13 ENCOUNTER — Other Ambulatory Visit: Payer: Self-pay | Admitting: Allergy

## 2018-04-16 ENCOUNTER — Ambulatory Visit (INDEPENDENT_AMBULATORY_CARE_PROVIDER_SITE_OTHER): Payer: 59 | Admitting: Orthopedic Surgery

## 2018-04-20 ENCOUNTER — Encounter (INDEPENDENT_AMBULATORY_CARE_PROVIDER_SITE_OTHER): Payer: Self-pay | Admitting: Orthopedic Surgery

## 2018-04-23 NOTE — Telephone Encounter (Signed)
We can try therapy.  Again the fact that the shot did not help makes me wonder if this could be potentially coming from her back.  Please call her to find out if she is having any back pain or numbness and tingling.  If so we may need to order an MRI scan of the lumbar spine.  Could always see her back for return office visit but I think the 2 options would be therapy for the hip and/or MRI scan of the back to rule out referred pain.

## 2018-04-24 ENCOUNTER — Ambulatory Visit (INDEPENDENT_AMBULATORY_CARE_PROVIDER_SITE_OTHER): Payer: 59 | Admitting: Allergy and Immunology

## 2018-04-24 ENCOUNTER — Encounter: Payer: Self-pay | Admitting: Allergy and Immunology

## 2018-04-24 VITALS — BP 124/70 | HR 80 | Temp 98.1°F | Resp 18

## 2018-04-24 DIAGNOSIS — J3089 Other allergic rhinitis: Secondary | ICD-10-CM | POA: Diagnosis not present

## 2018-04-24 DIAGNOSIS — H6981 Other specified disorders of Eustachian tube, right ear: Secondary | ICD-10-CM

## 2018-04-24 DIAGNOSIS — J0140 Acute pansinusitis, unspecified: Secondary | ICD-10-CM

## 2018-04-24 DIAGNOSIS — H6991 Unspecified Eustachian tube disorder, right ear: Secondary | ICD-10-CM

## 2018-04-24 DIAGNOSIS — K219 Gastro-esophageal reflux disease without esophagitis: Secondary | ICD-10-CM

## 2018-04-24 DIAGNOSIS — J4540 Moderate persistent asthma, uncomplicated: Secondary | ICD-10-CM | POA: Diagnosis not present

## 2018-04-24 DIAGNOSIS — D802 Selective deficiency of immunoglobulin A [IgA]: Secondary | ICD-10-CM
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
## 2018-04-24 NOTE — Patient Instructions (Addendum)
  1. Continue to Treat and prevent inflammation:   A. Nasacort 1-2 sprays each nostril one time per day  B. Symbicort 160 - 2 inhalations 1- 2 times per day  C. Montelukast 10mg  1 tablet one time per day  E. Dupilumab injections every 2 weeks  2. Continue to treat and prevent reflux:    A.  Generic Zegerid 40 mg twice a day  3.  If needed:   A. ipratropium 0.06% 2 sprays each nostril every 6 hours   B. nasal saline wash  C. pro-air respiclick or albuterol nebulization  D. over-the-counter antihistamine  E. EpiPen  4.  Add Flovent 110 - 2 inhalations 2 times a day to Symbicort during 'Flare up"  5.  For this viral respiratory tract infection utilize the following:   A.  Lots of nasal saline multiple times a day  B.  Mucinex DM 1-2 tablets 2 times a day  C.  OTC ibuprofen  D.  Prednisone 10 mg 1 time a day for 5 days only  6.  Antibiotics?  7. Return to clinic 6 months or earlier if problem

## 2018-04-25 ENCOUNTER — Encounter: Payer: Self-pay | Admitting: Allergy and Immunology

## 2018-04-25 NOTE — Telephone Encounter (Signed)
Pls fax pt and mri report and call tiff

## 2018-04-26 ENCOUNTER — Other Ambulatory Visit: Payer: Self-pay

## 2018-04-26 ENCOUNTER — Telehealth: Payer: Self-pay | Admitting: *Deleted

## 2018-04-26 ENCOUNTER — Encounter: Payer: Self-pay | Admitting: Allergy and Immunology

## 2018-04-26 MED ORDER — AMOXICILLIN-POT CLAVULANATE 875-125 MG PO TABS: ORAL_TABLET | ORAL | 0 refills | Status: DC

## 2018-04-26 MED ORDER — BALOXAVIR MARBOXIL(80 MG DOSE) 2 X 40 MG PO TBPK: 2.0000 | ORAL_TABLET | Freq: Once | ORAL | 0 refills | Status: AC

## 2018-04-26 NOTE — Telephone Encounter (Signed)
Dr Ammie Ferrier

## 2018-04-26 NOTE — Telephone Encounter (Signed)
Rx sent to [facility]. Patient was informed by MyChart message from Dr.Kozlow and also spoke with staff at [facility] office.

## 2018-04-26 NOTE — Telephone Encounter (Signed)
Patient called stating she sent Dr Lucie Leather a message through her mychart, patient states she really wants Dr Lucie Leather to read it. Patient states her symptoms has gotten worse. Please call [phone]

## 2018-04-26 NOTE — Telephone Encounter (Signed)
Please prescribe prescribe xofluza (two 40mg  tablets) as a single dose and augmentin 875 one tablet two times per day for 10 days. Either provide the coupon or see if she can download a coupon for xofluza.

## 2018-04-30 ENCOUNTER — Telehealth: Payer: Self-pay | Admitting: *Deleted

## 2018-04-30 MED ORDER — PREDNISONE 10 MG PO TABS: ORAL_TABLET | ORAL | 0 refills | Status: DC

## 2018-04-30 NOTE — Telephone Encounter (Signed)
Informed patient of Prednisone. Sent script to [facility].

## 2018-04-30 NOTE — Telephone Encounter (Signed)
-----   Message from Jessica Priest, MD sent at 04/30/2018  2:09 PM EST ----- Please send in a presciption for prednisone 10mg  two times a day for 5 days, then one time per day for 5 days

## 2018-05-15 ENCOUNTER — Encounter: Payer: Self-pay | Admitting: Allergy and Immunology

## 2018-05-15 MED ORDER — FLUCONAZOLE 150 MG PO TABS: 150.0000 mg | ORAL_TABLET | Freq: Once | ORAL | 0 refills | Status: AC

## 2018-05-15 NOTE — Telephone Encounter (Signed)
-----   Message from Jessica Priest, MD sent at 05/15/2018  6:40 PM EST ----- Please prescribe Diflucan 150 mg tablet single dose and inform patient of prescription.

## 2018-06-08 ENCOUNTER — Encounter (INDEPENDENT_AMBULATORY_CARE_PROVIDER_SITE_OTHER): Payer: Self-pay | Admitting: Orthopedic Surgery

## 2018-06-08 ENCOUNTER — Ambulatory Visit (INDEPENDENT_AMBULATORY_CARE_PROVIDER_SITE_OTHER): Payer: 59 | Admitting: Orthopedic Surgery

## 2018-06-08 ENCOUNTER — Ambulatory Visit (INDEPENDENT_AMBULATORY_CARE_PROVIDER_SITE_OTHER): Payer: Self-pay

## 2018-06-08 DIAGNOSIS — G8929 Other chronic pain: Secondary | ICD-10-CM | POA: Diagnosis not present

## 2018-06-08 DIAGNOSIS — M79605 Pain in left leg: Secondary | ICD-10-CM

## 2018-06-08 DIAGNOSIS — M5442 Lumbago with sciatica, left side: Secondary | ICD-10-CM
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
## 2018-06-13 ENCOUNTER — Telehealth (INDEPENDENT_AMBULATORY_CARE_PROVIDER_SITE_OTHER): Payer: Self-pay | Admitting: *Deleted

## 2018-06-13 NOTE — Telephone Encounter (Signed)
Pt is scheduled with [facility] MRI dept on Fri Mar 6 at 8:30pm, IC and left message with pt to return call for appt information. I have faxed order to Janalyn Harder with [facility].

## 2018-06-15 ENCOUNTER — Other Ambulatory Visit: Payer: Self-pay | Admitting: *Deleted

## 2018-06-15 MED ORDER — BUDESONIDE 0.5 MG/2ML IN SUSP: RESPIRATORY_TRACT | Status: DC

## 2018-06-15 MED ORDER — ALBUTEROL SULFATE 108 (90 BASE) MCG/ACT IN AEPB: 2.0000 | INHALATION_SPRAY | RESPIRATORY_TRACT | 1 refills | Status: DC | PRN

## 2018-06-15 MED ORDER — ALBUTEROL SULFATE (2.5 MG/3ML) 0.083% IN NEBU: 2.5000 mg | INHALATION_SOLUTION | RESPIRATORY_TRACT | 1 refills | Status: DC | PRN

## 2018-06-15 MED ORDER — IPRATROPIUM BROMIDE 0.06 % NA SOLN: NASAL | 1 refills | Status: DC

## 2018-06-15 MED ORDER — BUDESONIDE-FORMOTEROL FUMARATE 160-4.5 MCG/ACT IN AERO: INHALATION_SPRAY | RESPIRATORY_TRACT | 1 refills | Status: DC

## 2018-06-15 MED ORDER — MONTELUKAST SODIUM 10 MG PO TABS: 10.0000 mg | ORAL_TABLET | Freq: Every day | ORAL | 1 refills | Status: DC

## 2018-06-15 MED ORDER — FLUTICASONE PROPIONATE HFA 110 MCG/ACT IN AERO: INHALATION_SPRAY | RESPIRATORY_TRACT | 1 refills | Status: DC

## 2018-06-18 ENCOUNTER — Encounter: Payer: Self-pay | Admitting: Allergy and Immunology

## 2018-06-18 ENCOUNTER — Other Ambulatory Visit: Payer: Self-pay | Admitting: *Deleted

## 2018-06-18 ENCOUNTER — Ambulatory Visit
Admission: RE | Admit: 2018-06-18 | Discharge: 2018-06-18 | Disposition: A | Discharge status: Home / Self Care | Payer: 59 | Source: Ambulatory Visit | Attending: Allergy and Immunology | Admitting: Allergy and Immunology

## 2018-06-18 ENCOUNTER — Telehealth: Payer: Self-pay | Admitting: *Deleted

## 2018-06-18 DIAGNOSIS — R059 Cough, unspecified: Secondary | ICD-10-CM

## 2018-06-18 DIAGNOSIS — R05 Cough: Secondary | ICD-10-CM

## 2018-06-18 MED ORDER — BUDESONIDE 0.5 MG/2ML IN SUSP: RESPIRATORY_TRACT | 1 refills | Status: DC

## 2018-06-18 NOTE — Telephone Encounter (Signed)
-----   Message from Jessica Priest, MD sent at 06/18/2018  2:31 PM EDT ----- Please schedule patient for a CXR for cough.

## 2018-06-19 ENCOUNTER — Ambulatory Visit: Payer: 59 | Admitting: Allergy and Immunology

## 2018-06-19 ENCOUNTER — Encounter: Payer: Self-pay | Admitting: Allergy and Immunology

## 2018-06-19 VITALS — BP 118/80 | HR 76 | Temp 97.6°F | Resp 16

## 2018-06-19 DIAGNOSIS — J3089 Other allergic rhinitis: Secondary | ICD-10-CM

## 2018-06-19 DIAGNOSIS — K219 Gastro-esophageal reflux disease without esophagitis: Secondary | ICD-10-CM | POA: Diagnosis not present

## 2018-06-19 DIAGNOSIS — J4550 Severe persistent asthma, uncomplicated: Secondary | ICD-10-CM | POA: Diagnosis not present

## 2018-06-19 DIAGNOSIS — Z91038 Other insect allergy status: Secondary | ICD-10-CM

## 2018-06-19 DIAGNOSIS — Z91030 Bee allergy status: Secondary | ICD-10-CM

## 2018-06-19 DIAGNOSIS — D802 Selective deficiency of immunoglobulin A [IgA]: Secondary | ICD-10-CM | POA: Diagnosis not present

## 2018-06-19 MED ORDER — OMEPRAZOLE 40 MG PO CPDR: DELAYED_RELEASE_CAPSULE | ORAL | 5 refills | Status: DC

## 2018-06-19 MED ORDER — SODIUM BICARBONATE 650 MG PO TABS: ORAL_TABLET | ORAL | 3 refills | Status: DC

## 2018-06-19 MED ORDER — FLUTICASONE PROPIONATE HFA 110 MCG/ACT IN AERO: 2.0000 | INHALATION_SPRAY | Freq: Two times a day (BID) | RESPIRATORY_TRACT | 5 refills | Status: DC

## 2018-06-19 MED ORDER — BUDESONIDE-FORMOTEROL FUMARATE 160-4.5 MCG/ACT IN AERO: INHALATION_SPRAY | RESPIRATORY_TRACT | 3 refills | Status: DC

## 2018-06-19 NOTE — Patient Instructions (Addendum)
  1. Continue to Treat and prevent inflammation:   A. Nasacort 1-2 sprays each nostril one time per day  B. Symbicort 160 - 2 inhalations 1- 2 times per day  C. Montelukast 10mg  1 tablet one time per day  E. Dupilumab injections every 2 weeks  2. Continue to treat and prevent reflux:    A.  Generic Zegerid 40 mg twice a day  3.  If needed:   A. ipratropium 0.06% 2 sprays each nostril every 6 hours   B. nasal saline wash  C. pro-air respiclick or albuterol nebulization  D. over-the-counter antihistamine / Mucinex DM   E. EpiPen  4.  Add Flovent 110 - 2 inhalations 2 times a day to Symbicort during 'Flare up"  5. Blood - Alpha 1 antitrypsin level and phenotype, IgA/G/M, anti-pneumo ab    6. Diflucan 150 mg tablet today.  7. Return to clinic summer 2020 or earlier if problem
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
## 2018-06-20 ENCOUNTER — Other Ambulatory Visit: Payer: Self-pay | Admitting: Allergy and Immunology

## 2018-06-20 ENCOUNTER — Encounter: Payer: Self-pay | Admitting: Allergy and Immunology

## 2018-06-20 ENCOUNTER — Other Ambulatory Visit: Payer: Self-pay | Admitting: *Deleted

## 2018-06-21 ENCOUNTER — Telehealth: Payer: Self-pay

## 2018-06-21 ENCOUNTER — Encounter (INDEPENDENT_AMBULATORY_CARE_PROVIDER_SITE_OTHER): Payer: Self-pay | Admitting: Orthopedic Surgery

## 2018-06-21 MED ORDER — FLUCONAZOLE 150 MG PO TABS: 150.0000 mg | ORAL_TABLET | Freq: Every day | ORAL | 0 refills | Status: DC

## 2018-06-21 NOTE — Telephone Encounter (Signed)
Spoke with patient who informed me that Diflucan was not sent in.  Have sent that medication over to the [facility] in [place].    She also requested an after visit summary.  I have placed that up front for her to pick up later this evening.

## 2018-06-25 LAB — STREP PNEUMONIAE 23 SEROTYPES IGG
Pneumo Ab Type 1*: 1.4 ug/mL (ref >1.3)
Pneumo Ab Type 12 (12F)*: 0.3 ug/mL — ABNORMAL LOW (ref >1.3)
Pneumo Ab Type 14*: 0.7 ug/mL — ABNORMAL LOW (ref >1.3)
Pneumo Ab Type 17 (17F)*: 0.9 ug/mL — ABNORMAL LOW (ref >1.3)
Pneumo Ab Type 19 (19F)*: 2.2 ug/mL (ref >1.3)
Pneumo Ab Type 2*: 4.1 ug/mL (ref >1.3)
Pneumo Ab Type 20*: 1.3 ug/mL — ABNORMAL LOW (ref >1.3)
Pneumo Ab Type 22 (22F)*: 0.6 ug/mL — ABNORMAL LOW (ref >1.3)
Pneumo Ab Type 23 (23F)*: 1.9 ug/mL (ref >1.3)
Pneumo Ab Type 26 (6B)*: 5.8 ug/mL (ref >1.3)
Pneumo Ab Type 3*: 16.9 ug/mL (ref >1.3)
Pneumo Ab Type 34 (10A)*: 0.9 ug/mL — ABNORMAL LOW (ref >1.3)
Pneumo Ab Type 4*: 1.1 ug/mL — ABNORMAL LOW (ref >1.3)
Pneumo Ab Type 43 (11A)*: 1 ug/mL — ABNORMAL LOW (ref >1.3)
Pneumo Ab Type 5*: 1.6 ug/mL (ref >1.3)
Pneumo Ab Type 51 (7F)*: 1.3 ug/mL — ABNORMAL LOW (ref >1.3)
Pneumo Ab Type 54 (15B)*: 1.4 ug/mL (ref >1.3)
Pneumo Ab Type 56 (18C)*: 9.1 ug/mL (ref >1.3)
Pneumo Ab Type 57 (19A)*: 12.9 ug/mL (ref >1.3)
Pneumo Ab Type 68 (9V)*: 3.2 ug/mL (ref >1.3)
Pneumo Ab Type 70 (33F)*: 2.6 ug/mL (ref >1.3)
Pneumo Ab Type 8*: 9.1 ug/mL (ref >1.3)
Pneumo Ab Type 9 (9N)*: 7.4 ug/mL (ref >1.3)

## 2018-06-25 LAB — ALPHA-1 ANTITRYPSIN PHENOTYPE: A-1 Antitrypsin: 125 mg/dL (ref 101–187)

## 2018-06-25 LAB — IGG, IGA, IGM
IgA/Immunoglobulin A, Serum: <5 mg/dL — ABNORMAL LOW (ref 87–352)
IgG (Immunoglobin G), Serum: 1553 mg/dL (ref 700–1600)
IgM (Immunoglobulin M), Srm: 126 mg/dL (ref 26–217)

## 2018-06-25 MED ORDER — FLUTICASONE PROPIONATE HFA 110 MCG/ACT IN AERO: 2.0000 | INHALATION_SPRAY | Freq: Two times a day (BID) | RESPIRATORY_TRACT | 5 refills | Status: DC

## 2018-06-26 ENCOUNTER — Telehealth (INDEPENDENT_AMBULATORY_CARE_PROVIDER_SITE_OTHER): Payer: Self-pay

## 2018-06-26 NOTE — Telephone Encounter (Signed)
Patient would like to know results of MRI of lumbar spine. Please advise. Thanks.

## 2018-08-30 ENCOUNTER — Encounter: Payer: Self-pay | Admitting: Allergy and Immunology

## 2018-09-04 ENCOUNTER — Telehealth: Payer: Self-pay | Admitting: *Deleted

## 2018-09-04 NOTE — Telephone Encounter (Signed)
Both letters completed and mailed to patient's home.

## 2018-09-04 NOTE — Telephone Encounter (Signed)
Sending to [place] pool so Dr. Lucie Leather can sign letter in the office.

## 2018-09-04 NOTE — Telephone Encounter (Signed)
Letter for work completed

## 2018-09-04 NOTE — Telephone Encounter (Signed)
-----   Message from Jessica Priest, MD sent at 09/04/2018  7:52 AM EDT ----- Please provide patient with the following letters:  1) Would you write a letter stating that due to my high risk health status, you recommend I work from home (avoid people, etc.) until the threat of the virus is gone or I have successfully received the vaccine (or any other appropriate language you feel explains the situation)?  2) I have to cancel a flight to [place] in July. Would you write a note or letter stating your recommendation that I not fly and avoid crowds, airports..... etc.?

## 2018-09-07 ENCOUNTER — Encounter: Payer: Self-pay | Admitting: Allergy and Immunology

## 2018-09-10 ENCOUNTER — Other Ambulatory Visit: Payer: Self-pay | Admitting: *Deleted

## 2018-09-10 MED ORDER — DUPIXENT 300 MG/2ML ~~LOC~~ SOSY: 300.0000 mg | PREFILLED_SYRINGE | SUBCUTANEOUS | 11 refills | Status: DC

## 2018-09-11 ENCOUNTER — Other Ambulatory Visit: Payer: Self-pay

## 2018-09-11 ENCOUNTER — Ambulatory Visit: Payer: 59

## 2018-09-11 ENCOUNTER — Ambulatory Visit: Payer: Self-pay

## 2018-09-11 ENCOUNTER — Encounter: Payer: Self-pay | Admitting: Orthopedic Surgery

## 2018-09-11 ENCOUNTER — Ambulatory Visit (INDEPENDENT_AMBULATORY_CARE_PROVIDER_SITE_OTHER): Payer: 59 | Admitting: Orthopedic Surgery

## 2018-09-11 DIAGNOSIS — S46011A Strain of muscle(s) and tendon(s) of the rotator cuff of right shoulder, initial encounter: Secondary | ICD-10-CM

## 2018-09-11 DIAGNOSIS — M25552 Pain in left hip: Secondary | ICD-10-CM | POA: Diagnosis not present

## 2018-09-11 DIAGNOSIS — M25511 Pain in right shoulder: Secondary | ICD-10-CM | POA: Diagnosis not present

## 2018-09-11 DIAGNOSIS — M79672 Pain in left foot: Secondary | ICD-10-CM
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
## 2018-09-11 NOTE — Addendum Note (Signed)
Addended byPrescott Parma on: 09/11/2018 01:55 PM   Modules accepted: Orders

## 2018-09-12 ENCOUNTER — Encounter: Payer: Self-pay | Admitting: Orthopedic Surgery

## 2018-09-27 ENCOUNTER — Telehealth: Payer: Self-pay | Admitting: Orthopedic Surgery

## 2018-09-27 NOTE — Telephone Encounter (Signed)
sw pt and advised her order was faxed to [facility] and they will be calling her to get her scheduled, also advised her [facility] does not do arthograms but [facility] in [place] does. Pt voiced understanding and will wait on their call, but still wants to keep the appt she has with [facility] in case she can not get into [facility] before the 30th of June.

## 2018-09-27 NOTE — Telephone Encounter (Signed)
Patient came in office saying she couldn't get in touch Sabrina about MRI. She wants her MRI request sent to [facility]. She's shopping around as far as her insurance goes. Patient can be reached at [phone]

## 2018-09-27 NOTE — Telephone Encounter (Signed)
FYI

## 2018-10-08 ENCOUNTER — Ambulatory Visit (INDEPENDENT_AMBULATORY_CARE_PROVIDER_SITE_OTHER): Payer: 59 | Admitting: Orthopedic Surgery

## 2018-10-08 ENCOUNTER — Other Ambulatory Visit: Payer: Self-pay

## 2018-10-08 ENCOUNTER — Encounter: Payer: Self-pay | Admitting: Orthopedic Surgery

## 2018-10-08 DIAGNOSIS — M25511 Pain in right shoulder: Secondary | ICD-10-CM | POA: Diagnosis not present

## 2018-10-09 ENCOUNTER — Other Ambulatory Visit: Payer: 59

## 2018-10-10 ENCOUNTER — Encounter: Payer: Self-pay | Admitting: Orthopedic Surgery
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
## 2018-10-17 ENCOUNTER — Ambulatory Visit: Payer: 59 | Admitting: Orthopedic Surgery

## 2018-10-18 ENCOUNTER — Other Ambulatory Visit: Payer: Self-pay | Admitting: Allergy and Immunology

## 2018-10-18 ENCOUNTER — Ambulatory Visit: Payer: 59 | Admitting: Orthopedic Surgery

## 2018-11-04 ENCOUNTER — Encounter: Payer: Self-pay | Admitting: Family Medicine

## 2018-11-05 ENCOUNTER — Other Ambulatory Visit: Payer: Self-pay | Admitting: Family Medicine

## 2018-11-05 ENCOUNTER — Encounter: Payer: Self-pay | Admitting: Family Medicine

## 2018-11-05 MED ORDER — DOXYCYCLINE HYCLATE 100 MG PO CAPS: 100.0000 mg | ORAL_CAPSULE | Freq: Two times a day (BID) | ORAL | 0 refills | Status: DC

## 2018-11-05 MED ORDER — MUPIROCIN 2 % EX OINT: 1.0000 "application " | TOPICAL_OINTMENT | Freq: Two times a day (BID) | CUTANEOUS | 0 refills | Status: DC

## 2018-11-13 ENCOUNTER — Encounter: Payer: Self-pay | Admitting: Family Medicine

## 2018-11-13 MED ORDER — ONDANSETRON HCL 4 MG PO TABS: 4.0000 mg | ORAL_TABLET | Freq: Three times a day (TID) | ORAL | 6 refills | Status: DC | PRN

## 2018-11-14 ENCOUNTER — Encounter: Payer: Self-pay | Admitting: Allergy and Immunology

## 2018-11-21 ENCOUNTER — Telehealth: Payer: Self-pay | Admitting: Family Medicine

## 2018-11-21 ENCOUNTER — Other Ambulatory Visit: Payer: Self-pay

## 2018-11-21 ENCOUNTER — Encounter: Payer: Self-pay | Admitting: Family Medicine

## 2018-11-21 DIAGNOSIS — Z20822 Contact with and (suspected) exposure to covid-19: Secondary | ICD-10-CM

## 2018-11-21 NOTE — Telephone Encounter (Signed)
Called patient left message to call concerning scheduling an appointment with Dr Junius Roads for a physical

## 2018-11-23 ENCOUNTER — Encounter: Payer: Self-pay | Admitting: Orthopedic Surgery

## 2018-11-23 LAB — NOVEL CORONAVIRUS, NAA: SARS-CoV-2, NAA: NOT DETECTED

## 2018-11-23 NOTE — Telephone Encounter (Signed)
I would do injection first give it a week and then start PT

## 2018-11-27 NOTE — Telephone Encounter (Signed)
Hilts would be go to do that injection thx

## 2018-11-29 ENCOUNTER — Ambulatory Visit: Payer: 59 | Admitting: Family Medicine

## 2018-11-30 ENCOUNTER — Ambulatory Visit: Payer: 59 | Admitting: Family Medicine

## 2018-12-05 ENCOUNTER — Ambulatory Visit (INDEPENDENT_AMBULATORY_CARE_PROVIDER_SITE_OTHER): Payer: 59 | Admitting: Family Medicine

## 2018-12-05 ENCOUNTER — Encounter: Payer: Self-pay | Admitting: Family Medicine

## 2018-12-05 VITALS — BP 111/64 | HR 64 | Temp 98.3°F | Resp 12 | Ht 64.0 in | Wt 155.0 lb

## 2018-12-05 DIAGNOSIS — K582 Mixed irritable bowel syndrome: Secondary | ICD-10-CM | POA: Diagnosis not present

## 2018-12-05 DIAGNOSIS — M25511 Pain in right shoulder: Secondary | ICD-10-CM | POA: Diagnosis not present

## 2018-12-05 DIAGNOSIS — K219 Gastro-esophageal reflux disease without esophagitis: Secondary | ICD-10-CM

## 2018-12-05 DIAGNOSIS — J4541 Moderate persistent asthma with (acute) exacerbation: Secondary | ICD-10-CM

## 2018-12-05 DIAGNOSIS — Z0000 Encounter for general adult medical examination without abnormal findings: Secondary | ICD-10-CM

## 2018-12-05 DIAGNOSIS — F418 Other specified anxiety disorders: Secondary | ICD-10-CM | POA: Insufficient documentation

## 2018-12-05 DIAGNOSIS — E785 Hyperlipidemia, unspecified: Secondary | ICD-10-CM | POA: Diagnosis not present

## 2018-12-05 MED ORDER — LORAZEPAM 0.5 MG PO TABS: 0.5000 mg | ORAL_TABLET | Freq: Two times a day (BID) | ORAL | 1 refills | Status: DC | PRN
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
## 2018-12-06 ENCOUNTER — Telehealth: Payer: Self-pay | Admitting: Family Medicine

## 2018-12-06 LAB — COMPREHENSIVE METABOLIC PANEL
AG Ratio: 1.6 (calc) (ref 1.0–2.5)
ALT: 16 U/L (ref 6–29)
AST: 19 U/L (ref 10–35)
Albumin: 4.5 g/dL (ref 3.6–5.1)
Alkaline phosphatase (APISO): 61 U/L (ref 37–153)
BUN: 16 mg/dL (ref 7–25)
CO2: 29 mmol/L (ref 20–32)
Calcium: 9.7 mg/dL (ref 8.6–10.4)
Chloride: 101 mmol/L (ref 98–110)
Creat: 0.81 mg/dL (ref 0.50–1.05)
Globulin: 2.9 g/dL (calc) (ref 1.9–3.7)
Glucose, Bld: 76 mg/dL (ref 65–99)
Potassium: 4.1 mmol/L (ref 3.5–5.3)
Sodium: 139 mmol/L (ref 135–146)
Total Bilirubin: 0.4 mg/dL (ref 0.2–1.2)
Total Protein: 7.4 g/dL (ref 6.1–8.1)

## 2018-12-06 LAB — CBC WITH DIFFERENTIAL/PLATELET
Absolute Monocytes: 360 cells/uL (ref 200–950)
Basophils Absolute: 10 cells/uL (ref 0–200)
Basophils Relative: 0.2 %
Eosinophils Absolute: 70 cells/uL (ref 15–500)
Eosinophils Relative: 1.4 %
HCT: 39.3 % (ref 35.0–45.0)
Hemoglobin: 12.9 g/dL (ref 11.7–15.5)
Lymphs Abs: 1800 cells/uL (ref 850–3900)
MCH: 29.1 pg (ref 27.0–33.0)
MCHC: 32.8 g/dL (ref 32.0–36.0)
MCV: 88.7 fL (ref 80.0–100.0)
MPV: 11 fL (ref 7.5–12.5)
Monocytes Relative: 7.2 %
Neutro Abs: 2760 cells/uL (ref 1500–7800)
Neutrophils Relative %: 55.2 %
Platelets: 196 10*3/uL (ref 140–400)
RBC: 4.43 10*6/uL (ref 3.80–5.10)
RDW: 12.1 % (ref 11.0–15.0)
Total Lymphocyte: 36 %
WBC: 5 10*3/uL (ref 3.8–10.8)

## 2018-12-06 LAB — LIPID PANEL
Cholesterol: 215 mg/dL — ABNORMAL HIGH (ref <200)
HDL: 60 mg/dL (ref >=50)
LDL Cholesterol (Calc): 139 mg/dL (calc) — ABNORMAL HIGH
Non-HDL Cholesterol (Calc): 155 mg/dL (calc) — ABNORMAL HIGH (ref <130)
Total CHOL/HDL Ratio: 3.6 (calc) (ref <5.0)
Triglycerides: 67 mg/dL (ref <150)

## 2018-12-06 LAB — THYROID PANEL WITH TSH
Free Thyroxine Index: 2.7 (ref 1.4–3.8)
T3 Uptake: 32 % (ref 22–35)
T4, Total: 8.5 ug/dL (ref 5.1–11.9)
TSH: 1.53 mIU/L

## 2018-12-06 NOTE — Telephone Encounter (Signed)
Lipids are higher than last year.

## 2018-12-10 ENCOUNTER — Encounter: Payer: Self-pay | Admitting: Orthopedic Surgery

## 2018-12-10 NOTE — Telephone Encounter (Signed)
Not exactly sure what the status is on that.  Forearm and hand and scapular issues make me think more of the neck.  Could just be reaction from the shot.  I think I would wait it out at this point.  Nothing really actionable at this time

## 2019-01-02 ENCOUNTER — Encounter: Payer: Self-pay | Admitting: Orthopedic Surgery

## 2019-01-07 ENCOUNTER — Encounter: Payer: Self-pay | Admitting: Family Medicine

## 2019-01-07 MED ORDER — CLONAZEPAM 0.5 MG PO TABS: 0.5000 mg | ORAL_TABLET | Freq: Two times a day (BID) | ORAL | 1 refills | Status: DC | PRN

## 2019-01-07 NOTE — Telephone Encounter (Signed)
Ambien 5 mg po q hs prn # 30 pls call thx

## 2019-01-08 ENCOUNTER — Other Ambulatory Visit: Payer: Self-pay | Admitting: Surgical

## 2019-01-08 ENCOUNTER — Encounter: Payer: Self-pay | Admitting: Allergy and Immunology

## 2019-01-08 ENCOUNTER — Other Ambulatory Visit: Payer: Self-pay

## 2019-01-08 MED ORDER — ZOLPIDEM TARTRATE 5 MG PO TABS: ORAL_TABLET | ORAL | 0 refills | Status: DC

## 2019-01-08 MED ORDER — SODIUM BICARBONATE 650 MG PO TABS: ORAL_TABLET | ORAL | 3 refills | Status: DC

## 2019-01-10 NOTE — Telephone Encounter (Signed)
Sure thx

## 2019-01-11 ENCOUNTER — Encounter: Payer: Self-pay | Admitting: Allergy and Immunology

## 2019-01-11 ENCOUNTER — Other Ambulatory Visit: Payer: Self-pay | Admitting: Allergy and Immunology

## 2019-01-11 NOTE — Telephone Encounter (Signed)
Please Advise Dr. Neldon Mc

## 2019-01-23 ENCOUNTER — Encounter: Payer: Self-pay | Admitting: Orthopedic Surgery

## 2019-01-31 ENCOUNTER — Encounter: Payer: Self-pay | Admitting: Allergy and Immunology

## 2019-02-06 ENCOUNTER — Ambulatory Visit: Payer: 59 | Admitting: Orthopedic Surgery

## 2019-02-06 ENCOUNTER — Encounter: Payer: Self-pay | Admitting: Orthopedic Surgery

## 2019-02-07 ENCOUNTER — Other Ambulatory Visit: Payer: Self-pay

## 2019-02-07 ENCOUNTER — Ambulatory Visit: Payer: 59 | Admitting: Orthopedic Surgery

## 2019-02-07 ENCOUNTER — Encounter: Payer: Self-pay | Admitting: Orthopedic Surgery

## 2019-02-07 DIAGNOSIS — S46011D Strain of muscle(s) and tendon(s) of the rotator cuff of right shoulder, subsequent encounter: Secondary | ICD-10-CM

## 2019-02-07 DIAGNOSIS — M7501 Adhesive capsulitis of right shoulder: Secondary | ICD-10-CM
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
## 2019-02-08 ENCOUNTER — Ambulatory Visit: Payer: 59 | Admitting: Orthopedic Surgery

## 2019-02-12 ENCOUNTER — Encounter: Payer: Self-pay | Admitting: Orthopedic Surgery

## 2019-02-13 ENCOUNTER — Other Ambulatory Visit: Payer: Self-pay

## 2019-02-13 ENCOUNTER — Encounter: Payer: Self-pay | Admitting: Family Medicine

## 2019-02-13 DIAGNOSIS — Z20822 Contact with and (suspected) exposure to covid-19: Secondary | ICD-10-CM

## 2019-02-13 MED ORDER — MOMETASONE FUROATE 0.1 % EX CREA: 1.0000 "application " | TOPICAL_CREAM | Freq: Every day | CUTANEOUS | 0 refills | Status: DC

## 2019-02-14 LAB — NOVEL CORONAVIRUS, NAA: SARS-CoV-2, NAA: NOT DETECTED

## 2019-02-19 ENCOUNTER — Encounter: Payer: Self-pay | Admitting: Allergy and Immunology

## 2019-02-19 ENCOUNTER — Ambulatory Visit: Payer: 59 | Admitting: Allergy and Immunology

## 2019-02-19 ENCOUNTER — Other Ambulatory Visit: Payer: Self-pay

## 2019-02-19 VITALS — BP 118/88 | HR 89 | Temp 97.3°F | Resp 18 | Ht 64.0 in | Wt 162.4 lb

## 2019-02-19 DIAGNOSIS — Z91030 Bee allergy status: Secondary | ICD-10-CM

## 2019-02-19 DIAGNOSIS — Z91038 Other insect allergy status: Secondary | ICD-10-CM

## 2019-02-19 DIAGNOSIS — L237 Allergic contact dermatitis due to plants, except food: Secondary | ICD-10-CM

## 2019-02-19 DIAGNOSIS — J4550 Severe persistent asthma, uncomplicated: Secondary | ICD-10-CM

## 2019-02-19 DIAGNOSIS — D802 Selective deficiency of immunoglobulin A [IgA]: Secondary | ICD-10-CM | POA: Diagnosis not present

## 2019-02-19 DIAGNOSIS — K219 Gastro-esophageal reflux disease without esophagitis: Secondary | ICD-10-CM | POA: Diagnosis not present

## 2019-02-19 DIAGNOSIS — E8801 Alpha-1-antitrypsin deficiency: Secondary | ICD-10-CM

## 2019-02-19 DIAGNOSIS — J3089 Other allergic rhinitis: Secondary | ICD-10-CM

## 2019-02-19 MED ORDER — HALOBETASOL PROPIONATE 0.05 % EX CREA: TOPICAL_CREAM | Freq: Two times a day (BID) | CUTANEOUS | 2 refills | Status: DC

## 2019-02-19 NOTE — Patient Instructions (Addendum)
  1. Continue to Treat and prevent inflammation:   A. Nasacort 1-2 sprays each nostril one time per day  B. Montelukast 10mg  1 tablet one time per day  C. Dupilumab injections every 2 weeks  2. Continue to treat and prevent reflux:    A.  Generic Omeprazole 40 mg + bicarbonate twice a day  3.  If needed:   A. ipratropium 0.06% 2 sprays each nostril every 6 hours   B. nasal saline wash  C. pro-air respiclick or albuterol nebulization  D. over-the-counter antihistamine / Mucinex DM   E. EpiPen  4. Can restart Symbicort 160 - 2 inhalations 1- 2 times per day during increased asthma activity  5.  Add Flovent 110 - 2 inhalations 2 times a day to Symbicort during 'Flare up"  6. Treat [place] induced contact dermatitis:   A. Ultravate ointment - apply to dermatitis 2 times a day until resolved  B. Prednisone 10 mg - 1 tablet daily X10 days, then 1/2 tablet daily x 10 days   7. Return to clinic 6 months or earlier if problem  8. Obtain COVID vaccine when available
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
## 2019-02-20 ENCOUNTER — Telehealth: Payer: Self-pay | Admitting: Allergy and Immunology

## 2019-02-20 ENCOUNTER — Encounter: Payer: Self-pay | Admitting: Allergy and Immunology

## 2019-02-20 ENCOUNTER — Telehealth: Payer: Self-pay

## 2019-02-20 MED ORDER — HALOBETASOL PROPIONATE 0.05 % EX CREA: TOPICAL_CREAM | CUTANEOUS | 1 refills | Status: DC

## 2019-02-20 NOTE — Telephone Encounter (Signed)
Pt called and said that the Bactroban was not called into [facility]. [phone]

## 2019-02-20 NOTE — Telephone Encounter (Signed)
Called and talked with patient. She needs Ultravate sent into [facility].  [place] sent as requested.  Called and canceled RX sent to [facility].

## 2019-02-20 NOTE — Telephone Encounter (Signed)
Called [facility] and cancelled [place] for Ultravate cream.

## 2019-03-13 ENCOUNTER — Encounter: Payer: Self-pay | Admitting: Orthopedic Surgery

## 2019-03-13 ENCOUNTER — Ambulatory Visit: Payer: 59 | Admitting: Orthopedic Surgery

## 2019-03-13 ENCOUNTER — Other Ambulatory Visit: Payer: Self-pay

## 2019-03-13 DIAGNOSIS — S46011D Strain of muscle(s) and tendon(s) of the rotator cuff of right shoulder, subsequent encounter: Secondary | ICD-10-CM

## 2019-03-13 DIAGNOSIS — M7501 Adhesive capsulitis of right shoulder: Secondary | ICD-10-CM
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
## 2019-03-18 ENCOUNTER — Other Ambulatory Visit: Payer: Self-pay | Admitting: Surgical

## 2019-03-18 DIAGNOSIS — S46011D Strain of muscle(s) and tendon(s) of the rotator cuff of right shoulder, subsequent encounter: Secondary | ICD-10-CM | POA: Diagnosis not present

## 2019-03-18 DIAGNOSIS — S43431D Superior glenoid labrum lesion of right shoulder, subsequent encounter: Secondary | ICD-10-CM | POA: Diagnosis not present

## 2019-03-18 DIAGNOSIS — M7501 Adhesive capsulitis of right shoulder: Secondary | ICD-10-CM | POA: Diagnosis not present

## 2019-03-18 DIAGNOSIS — M19011 Primary osteoarthritis, right shoulder: Secondary | ICD-10-CM | POA: Diagnosis not present

## 2019-03-18 MED ORDER — ASPIRIN EC 81 MG PO TBEC: 81.0000 mg | DELAYED_RELEASE_TABLET | Freq: Every day | ORAL | 0 refills | Status: DC

## 2019-03-18 MED ORDER — METHOCARBAMOL 500 MG PO TABS: 500.0000 mg | ORAL_TABLET | Freq: Three times a day (TID) | ORAL | 0 refills | Status: DC | PRN

## 2019-03-18 MED ORDER — OXYCODONE HCL 5 MG PO TABS: 5.0000 mg | ORAL_TABLET | ORAL | 0 refills | Status: DC | PRN

## 2019-03-18 MED ORDER — DICLOFENAC SODIUM 50 MG PO TBEC: 50.0000 mg | DELAYED_RELEASE_TABLET | Freq: Two times a day (BID) | ORAL | 0 refills | Status: DC

## 2019-03-19 ENCOUNTER — Telehealth: Payer: Self-pay | Admitting: Orthopedic Surgery

## 2019-03-19 NOTE — Telephone Encounter (Signed)
Patient left a voicemail message stating that she had some questions in regards to her surgery.  She is wanting to know what was done during her surgery and also if she needs to put ice on her shoulder.  CB#[phone].  Thank you.

## 2019-03-19 NOTE — Telephone Encounter (Signed)
IC advised ok to use ice. She has a lot of questions regarding what was done during her surgery.  She said she was concerned about her bone density.

## 2019-03-20 ENCOUNTER — Other Ambulatory Visit: Payer: Self-pay

## 2019-03-20 ENCOUNTER — Telehealth: Payer: Self-pay | Admitting: Allergy and Immunology

## 2019-03-20 MED ORDER — MONTELUKAST SODIUM 10 MG PO TABS: 10.0000 mg | ORAL_TABLET | Freq: Every day | ORAL | 1 refills | Status: DC

## 2019-03-20 NOTE — Telephone Encounter (Signed)
Patient is requesting a refill for Montelukast. Last seen 02-19-2019. She wants it sent to her online pharmacy, [facility]. She said the last time it was sent, they didn't approve it.

## 2019-03-20 NOTE — Telephone Encounter (Signed)
Spoke with patient. Patient made aware that medication was sent to her requested pharmacy.

## 2019-03-27 ENCOUNTER — Encounter: Payer: Self-pay | Admitting: Orthopedic Surgery

## 2019-03-27 ENCOUNTER — Other Ambulatory Visit: Payer: Self-pay

## 2019-03-27 ENCOUNTER — Ambulatory Visit (INDEPENDENT_AMBULATORY_CARE_PROVIDER_SITE_OTHER): Payer: 59 | Admitting: Orthopedic Surgery

## 2019-03-27 DIAGNOSIS — S46011D Strain of muscle(s) and tendon(s) of the rotator cuff of right shoulder, subsequent encounter: Secondary | ICD-10-CM

## 2019-03-27 DIAGNOSIS — M7501 Adhesive capsulitis of right shoulder: Secondary | ICD-10-CM
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
## 2019-03-28 ENCOUNTER — Encounter: Payer: Self-pay | Admitting: Orthopedic Surgery

## 2019-03-29 ENCOUNTER — Telehealth: Payer: Self-pay | Admitting: Orthopedic Surgery

## 2019-03-29 NOTE — Telephone Encounter (Signed)
Called and s/w patient.  Increase in pain yesterday following some rotation of her operative arm at the wrist joint.  Sudden increase in pain in operative shoulder.  Pain has improved since yesterday but still notes soreness.  No swelling or deformity of the biceps.  She is following all directions given at last OV.  Recommended she continue to take anti-inflammatory and try ice as well.  We will reassess at next OV and she will call if her pain/soreness worsens again.

## 2019-03-29 NOTE — Telephone Encounter (Signed)
Can you please advise? Thanks.

## 2019-03-29 NOTE — Telephone Encounter (Signed)
Patient called. She had surgery on right shoulder two weeks ago by Dr. Marlou Sa and was fine until last night 12/17. Complains that her right arm bicep has been extremely sore. She wants to know to know if it should be expected.   Call back number: [phone]

## 2019-03-30 NOTE — Telephone Encounter (Signed)
Glad she is happy.  See you soon.  Still early days.

## 2019-04-01 ENCOUNTER — Encounter: Payer: Self-pay | Admitting: Allergy and Immunology

## 2019-04-02 ENCOUNTER — Encounter: Payer: Self-pay | Admitting: Family Medicine

## 2019-04-13 ENCOUNTER — Other Ambulatory Visit: Payer: Self-pay | Admitting: Surgical

## 2019-04-15 NOTE — Telephone Encounter (Signed)
Please advise. Thanks.  

## 2019-04-17 ENCOUNTER — Other Ambulatory Visit: Payer: Self-pay

## 2019-04-17 ENCOUNTER — Encounter: Payer: Self-pay | Admitting: Orthopedic Surgery

## 2019-04-17 ENCOUNTER — Ambulatory Visit (INDEPENDENT_AMBULATORY_CARE_PROVIDER_SITE_OTHER): Payer: 59 | Admitting: Orthopedic Surgery

## 2019-04-17 DIAGNOSIS — S46011D Strain of muscle(s) and tendon(s) of the rotator cuff of right shoulder, subsequent encounter: Secondary | ICD-10-CM

## 2019-04-17 MED ORDER — TRAMADOL HCL 50 MG PO TABS: 50.0000 mg | ORAL_TABLET | Freq: Two times a day (BID) | ORAL | 0 refills | Status: DC | PRN

## 2019-04-18 ENCOUNTER — Encounter: Payer: Self-pay | Admitting: Orthopedic Surgery
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
## 2019-05-03 ENCOUNTER — Encounter: Payer: Self-pay | Admitting: Family Medicine

## 2019-05-09 ENCOUNTER — Other Ambulatory Visit: Payer: Self-pay | Admitting: Allergy and Immunology

## 2019-05-09 ENCOUNTER — Encounter: Payer: Self-pay | Admitting: Orthopedic Surgery

## 2019-05-09 ENCOUNTER — Encounter: Payer: Self-pay | Admitting: Family Medicine

## 2019-05-09 NOTE — Telephone Encounter (Signed)
I would give it a week and then come in if it is not improving.  Okay to extend therapy visits if they will do it.  3 times a week for 4 more weeks through February would be ideal.  Thanks

## 2019-05-10 MED ORDER — HYOSCYAMINE SULFATE 0.125 MG SL SUBL: 0.1250 mg | SUBLINGUAL_TABLET | Freq: Four times a day (QID) | SUBLINGUAL | 11 refills | Status: AC | PRN

## 2019-05-10 MED ORDER — DILTIAZEM HCL 60 MG PO TABS: 60.0000 mg | ORAL_TABLET | Freq: Four times a day (QID) | ORAL | 11 refills | Status: AC | PRN

## 2019-05-11 ENCOUNTER — Encounter: Payer: Self-pay | Admitting: Allergy and Immunology

## 2019-05-13 ENCOUNTER — Other Ambulatory Visit: Payer: Self-pay | Admitting: *Deleted

## 2019-05-13 MED ORDER — OMEPRAZOLE 40 MG PO CPDR: DELAYED_RELEASE_CAPSULE | ORAL | 5 refills | Status: DC

## 2019-05-13 MED ORDER — SODIUM BICARBONATE 650 MG PO TABS: ORAL_TABLET | ORAL | 3 refills | Status: DC

## 2019-05-15 ENCOUNTER — Ambulatory Visit (INDEPENDENT_AMBULATORY_CARE_PROVIDER_SITE_OTHER): Payer: 59 | Admitting: Orthopedic Surgery

## 2019-05-15 ENCOUNTER — Other Ambulatory Visit: Payer: Self-pay

## 2019-05-15 DIAGNOSIS — S46011D Strain of muscle(s) and tendon(s) of the rotator cuff of right shoulder, subsequent encounter: Secondary | ICD-10-CM

## 2019-05-17 ENCOUNTER — Encounter: Payer: Self-pay | Admitting: Orthopedic Surgery
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
## 2019-05-23 ENCOUNTER — Encounter: Payer: Self-pay | Admitting: Gastroenterology

## 2019-06-06 ENCOUNTER — Encounter: Payer: Self-pay | Admitting: Orthopedic Surgery

## 2019-06-07 ENCOUNTER — Encounter: Payer: Self-pay | Admitting: Family Medicine

## 2019-06-12 ENCOUNTER — Encounter: Payer: Self-pay | Admitting: Orthopedic Surgery

## 2019-06-12 ENCOUNTER — Telehealth: Payer: Self-pay | Admitting: Surgical

## 2019-06-12 ENCOUNTER — Encounter: Payer: Self-pay | Admitting: Allergy and Immunology

## 2019-06-12 NOTE — Telephone Encounter (Signed)
Received call from [facility] with [employer] needing treatment records for DOS 03/18/2019 sent to [address] [place], [address] Claim# [id number]  She advised they need the records in order to pay the claim.  The number to contact Judeth Cornfield is [phone]

## 2019-06-14 ENCOUNTER — Encounter: Payer: Self-pay | Admitting: Family Medicine

## 2019-06-15 ENCOUNTER — Ambulatory Visit: Payer: 59 | Attending: Internal Medicine

## 2019-06-15 DIAGNOSIS — Z23 Encounter for immunization: Secondary | ICD-10-CM | POA: Insufficient documentation

## 2019-06-15 NOTE — Progress Notes (Signed)
   Covid-19 Vaccination Clinic  Name:  Peggy Wallace    MRN: [id number] DOB: [birth year]  06/15/2019  Peggy Wallace was observed post Covid-19 immunization for 15 minutes without incident. She was provided with Vaccine Information Sheet and instruction to access the V-Safe system.   Peggy Wallace was instructed to call 911 with any severe reactions post vaccine: Marland Kitchen Difficulty breathing  . Swelling of face and throat  . A fast heartbeat  . A bad rash all over body  . Dizziness and weakness   Immunizations Administered    Name Date Dose VIS Date Route   Pfizer COVID-19 Vaccine 06/15/2019  5:03 PM 0.3 mL 03/22/2019 Intramuscular   Manufacturer: [employer], [employer]   Lot: [id number]   NDC: [address]-1

## 2019-06-19 ENCOUNTER — Ambulatory Visit: Payer: 59 | Admitting: Gastroenterology

## 2019-06-26 ENCOUNTER — Other Ambulatory Visit: Payer: Self-pay

## 2019-06-26 ENCOUNTER — Ambulatory Visit (INDEPENDENT_AMBULATORY_CARE_PROVIDER_SITE_OTHER): Payer: 59 | Admitting: Orthopedic Surgery

## 2019-06-26 DIAGNOSIS — S46011D Strain of muscle(s) and tendon(s) of the rotator cuff of right shoulder, subsequent encounter: Secondary | ICD-10-CM

## 2019-06-28 ENCOUNTER — Encounter: Payer: Self-pay | Admitting: Orthopedic Surgery
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
## 2019-07-01 NOTE — Telephone Encounter (Signed)
Ok to drop that code to lessen the confusion thx

## 2019-07-03 ENCOUNTER — Encounter: Payer: Self-pay | Admitting: Allergy and Immunology

## 2019-07-06 ENCOUNTER — Ambulatory Visit: Payer: 59 | Attending: Internal Medicine

## 2019-07-06 DIAGNOSIS — Z23 Encounter for immunization: Secondary | ICD-10-CM

## 2019-07-06 NOTE — Progress Notes (Signed)
   Covid-19 Vaccination Clinic  Name:  Peggy Wallace    MRN: [id number] DOB: [birth year]  07/06/2019  [name] was observed post Covid-19 immunization for 15 minutes without incident. She was provided with Vaccine Information Sheet and instruction to access the V-Safe system.   [name] was instructed to call 911 with any severe reactions post vaccine: Marland Kitchen Difficulty breathing  . Swelling of face and throat  . A fast heartbeat  . A bad rash all over body  . Dizziness and weakness   Immunizations Administered    Name Date Dose VIS Date Route   Pfizer COVID-19 Vaccine 07/06/2019 10:35 AM 0.3 mL 03/22/2019 Intramuscular   Manufacturer: [employer], [employer]   Lot: [id number]   NDC: [address]-5

## 2019-07-15 ENCOUNTER — Ambulatory Visit: Payer: 59

## 2019-07-15 ENCOUNTER — Other Ambulatory Visit: Payer: Self-pay | Admitting: Allergy and Immunology

## 2019-07-21 ENCOUNTER — Encounter: Payer: Self-pay | Admitting: Allergy and Immunology

## 2019-07-21 ENCOUNTER — Encounter: Payer: Self-pay | Admitting: Family Medicine

## 2019-07-27 ENCOUNTER — Other Ambulatory Visit: Payer: Self-pay | Admitting: Allergy and Immunology

## 2019-08-16 ENCOUNTER — Other Ambulatory Visit: Payer: Self-pay | Admitting: Allergy and Immunology

## 2019-08-20 ENCOUNTER — Other Ambulatory Visit: Payer: Self-pay

## 2019-08-20 ENCOUNTER — Encounter: Payer: Self-pay | Admitting: Allergy and Immunology

## 2019-08-20 ENCOUNTER — Ambulatory Visit: Payer: 59 | Admitting: Allergy and Immunology

## 2019-08-20 VITALS — BP 108/70 | HR 63 | Temp 97.3°F | Resp 18 | Ht 63.0 in | Wt 174.2 lb

## 2019-08-20 DIAGNOSIS — Z91030 Bee allergy status: Secondary | ICD-10-CM

## 2019-08-20 DIAGNOSIS — D802 Selective deficiency of immunoglobulin A [IgA]: Secondary | ICD-10-CM | POA: Diagnosis not present

## 2019-08-20 DIAGNOSIS — J3089 Other allergic rhinitis: Secondary | ICD-10-CM | POA: Diagnosis not present

## 2019-08-20 DIAGNOSIS — J4550 Severe persistent asthma, uncomplicated: Secondary | ICD-10-CM

## 2019-08-20 DIAGNOSIS — Z91038 Other insect allergy status: Secondary | ICD-10-CM

## 2019-08-20 DIAGNOSIS — K219 Gastro-esophageal reflux disease without esophagitis: Secondary | ICD-10-CM

## 2019-08-20 DIAGNOSIS — E8801 Alpha-1-antitrypsin deficiency: Secondary | ICD-10-CM

## 2019-08-20 MED ORDER — IPRATROPIUM BROMIDE 0.06 % NA SOLN: NASAL | 1 refills | Status: DC

## 2019-08-20 MED ORDER — OMEPRAZOLE 40 MG PO CPDR: DELAYED_RELEASE_CAPSULE | ORAL | 5 refills | Status: DC
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
## 2019-08-20 NOTE — Patient Instructions (Addendum)
  1. Continue to Treat and prevent inflammation:   A. Nasacort 1-2 sprays each nostril 1-7 times per week  B. Montelukast 10mg  1 tablet 1 time per day  C. Dupilumab injections every 2 weeks  2. Continue to treat and prevent reflux:    A.  Generic Omeprazole 40 mg + bicarbonate twice a day  3.  If needed:   A. ipratropium 0.06% 2 sprays each nostril every 6 hours   B. nasal saline wash  C. pro-air respiclick or albuterol nebulization  D. over-the-counter antihistamine / Mucinex DM   E. EpiPen / Auvi-Q 0.3  4. Can restart Symbicort 160 - 2 inhalations 1- 2 times per day during increased asthma activity  5. Return to clinic 6 months or earlier if problem

## 2019-08-21 ENCOUNTER — Encounter: Payer: Self-pay | Admitting: Allergy and Immunology

## 2019-08-21 ENCOUNTER — Telehealth: Payer: Self-pay | Admitting: *Deleted

## 2019-08-21 NOTE — Telephone Encounter (Signed)
-----   Message from Jessica Priest, MD sent at 08/21/2019  3:27 PM EDT ----- Please have patient visit with Pulmonary, Dr. Celine Mans, for asthma / MS alpha-1 antitrypsin heterozygote.  Please inform patient of appointment date.

## 2019-08-21 NOTE — Addendum Note (Signed)
Addended by: Dollene Cleveland R on: 08/21/2019 03:44 PM   Modules accepted: Orders

## 2019-08-21 NOTE — Telephone Encounter (Signed)
Ambulatory referral has been placed for patient to see Dr. Celine Mans for asthma and MS alpha-1 antitrypsin heterozygote.

## 2019-08-21 NOTE — Telephone Encounter (Signed)
-----   Message from Eric J Kozlow, MD sent at 08/21/2019  3:27 PM EDT ----- Please have patient visit with Pulmonary, Dr. Desai, for asthma / MS alpha-1 antitrypsin heterozygote.  Please inform patient of appointment date.  

## 2019-08-26 ENCOUNTER — Other Ambulatory Visit: Payer: Self-pay

## 2019-08-26 ENCOUNTER — Ambulatory Visit (INDEPENDENT_AMBULATORY_CARE_PROVIDER_SITE_OTHER): Payer: 59 | Admitting: Orthopedic Surgery

## 2019-08-26 ENCOUNTER — Encounter: Payer: Self-pay | Admitting: Allergy and Immunology

## 2019-08-26 DIAGNOSIS — S46011D Strain of muscle(s) and tendon(s) of the rotator cuff of right shoulder, subsequent encounter: Secondary | ICD-10-CM | POA: Diagnosis not present

## 2019-08-27 NOTE — Telephone Encounter (Signed)
Referral has been directed to the right workque for scheduling.  Thanks

## 2019-08-31 ENCOUNTER — Encounter: Payer: Self-pay | Admitting: Orthopedic Surgery
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
## 2019-09-11 ENCOUNTER — Encounter: Payer: Self-pay | Admitting: Allergy and Immunology

## 2019-09-11 ENCOUNTER — Telehealth: Payer: Self-pay

## 2019-09-11 MED ORDER — DUPIXENT 300 MG/2ML ~~LOC~~ SOSY: PREFILLED_SYRINGE | SUBCUTANEOUS | 11 refills | Status: DC

## 2019-09-11 NOTE — Telephone Encounter (Signed)
  Good morning: 1) I have not received any communication about an appointment with Dr. Celine Mans (pulmonary). 2) I am due for renewing my Dupixent and have no refills left.  My next shot is due in two weeks.  Can you please reset up with the coupon provided?  I use [employer].  Call with questions, thanks.  Peggy Wallace [phone]

## 2019-09-11 NOTE — Addendum Note (Signed)
Addended by: Devoria Glassing on: 09/11/2019 09:13 AM   Modules accepted: Orders

## 2019-09-11 NOTE — Telephone Encounter (Signed)
Peggy Wallace, can you follow up on the broken referral?

## 2019-09-11 NOTE — Telephone Encounter (Signed)
Refill sent to [facility] for Dupixent. Her copay card renews yearly

## 2019-09-12 NOTE — Telephone Encounter (Signed)
I spoke with [facility] and their front desk staff states its better to call for new patient appointments due to the amount of referrals they have coming in.   Patient is scheduled to see Dr Wynona Neat  As Dr Celine Mans is currently out on maternity leave and will not be back till September. Patient is scheduled for 10/16/19 @ 3:45.   I have informed the patient and she is going to call and reschedule the appointment for a different day as she will be out of town. Patient has also been informed about her Dupixent prescription.  Thanks

## 2019-09-12 NOTE — Telephone Encounter (Signed)
I just checked again and the patient has moved her appointment out to September when Dr Celine Mans comes back from maternity leave.  Thanks

## 2019-10-03 ENCOUNTER — Encounter: Payer: Self-pay | Admitting: Family Medicine

## 2019-10-03 DIAGNOSIS — M2550 Pain in unspecified joint: Secondary | ICD-10-CM

## 2019-10-04 ENCOUNTER — Ambulatory Visit (INDEPENDENT_AMBULATORY_CARE_PROVIDER_SITE_OTHER): Payer: 59

## 2019-10-04 ENCOUNTER — Other Ambulatory Visit: Payer: Self-pay

## 2019-10-04 DIAGNOSIS — M2550 Pain in unspecified joint: Secondary | ICD-10-CM

## 2019-10-04 NOTE — Progress Notes (Signed)
Labs drawn per Dr. Junius Roads: uric acid, ESR, RF, CCP antibody, CRP & ANA

## 2019-10-07 ENCOUNTER — Telehealth: Payer: Self-pay | Admitting: Family Medicine

## 2019-10-07 DIAGNOSIS — M2550 Pain in unspecified joint: Secondary | ICD-10-CM

## 2019-10-07 DIAGNOSIS — R7989 Other specified abnormal findings of blood chemistry: Secondary | ICD-10-CM

## 2019-10-07 DIAGNOSIS — R768 Other specified abnormal immunological findings in serum: Secondary | ICD-10-CM

## 2019-10-07 NOTE — Telephone Encounter (Signed)
Labs are notable for the following:  Uric acid is normal at 4.5.  Sed rate is normal at 9.  Rheumatoid factor is normal at less than 14.  C-reactive protein is normal at 2.5.  Anti-CCP antibodies are abnormal at 46.  This is in the "moderate positive" range.

## 2019-10-07 NOTE — Addendum Note (Signed)
Addended by: Lillia Carmel on: 10/07/2019 04:49 PM   Modules accepted: Orders

## 2019-10-08 ENCOUNTER — Telehealth: Payer: Self-pay | Admitting: Family Medicine

## 2019-10-08 LAB — SEDIMENTATION RATE: Sed Rate: 9 mm/h (ref 0–30)

## 2019-10-08 LAB — URIC ACID: Uric Acid, Serum: 4.5 mg/dL (ref 2.5–7.0)

## 2019-10-08 LAB — ANTI-NUCLEAR AB-TITER (ANA TITER): ANA Titer 1: 1:40 {titer} — ABNORMAL HIGH

## 2019-10-08 LAB — ANA: Anti Nuclear Antibody (ANA): POSITIVE — AB

## 2019-10-08 LAB — CYCLIC CITRUL PEPTIDE ANTIBODY, IGG: Cyclic Citrullin Peptide Ab: 46 UNITS — ABNORMAL HIGH

## 2019-10-08 LAB — RHEUMATOID FACTOR: Rheumatoid fact SerPl-aCnc: <14 IU/mL (ref <14)

## 2019-10-08 LAB — C-REACTIVE PROTEIN: CRP: 2.5 mg/L (ref <8.0)

## 2019-10-08 NOTE — Telephone Encounter (Signed)
ANA and anti-CCP are positive.

## 2019-10-16 ENCOUNTER — Institutional Professional Consult (permissible substitution): Payer: 59 | Admitting: Pulmonary Disease

## 2019-10-28 ENCOUNTER — Encounter: Payer: Self-pay | Admitting: Family Medicine

## 2019-10-28 DIAGNOSIS — R1084 Generalized abdominal pain: Secondary | ICD-10-CM

## 2019-10-28 DIAGNOSIS — R110 Nausea: Secondary | ICD-10-CM

## 2019-10-28 MED ORDER — PROMETHAZINE HCL 25 MG PO TABS
25.0000 mg | ORAL_TABLET | Freq: Four times a day (QID) | ORAL | 0 refills | Status: DC | PRN
Start: 2019-10-28 — End: 2020-02-20

## 2019-10-31 ENCOUNTER — Other Ambulatory Visit: Payer: Self-pay | Admitting: Family Medicine

## 2019-10-31 MED ORDER — ONDANSETRON 8 MG PO TBDP
8.0000 mg | ORAL_TABLET | Freq: Three times a day (TID) | ORAL | 3 refills | Status: DC | PRN
Start: 2019-10-31 — End: 2022-07-19
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
## 2019-11-11 NOTE — Addendum Note (Signed)
Addended by: Lillia Carmel on: 11/11/2019 01:19 PM   Modules accepted: Orders

## 2019-11-13 NOTE — Addendum Note (Signed)
Addended by: Lillia Carmel on: 11/13/2019 11:33 AM   Modules accepted: Orders

## 2019-11-20 ENCOUNTER — Ambulatory Visit: Payer: 59 | Admitting: Rheumatology

## 2019-11-20 ENCOUNTER — Encounter: Payer: Self-pay | Admitting: Rheumatology

## 2019-11-20 ENCOUNTER — Other Ambulatory Visit: Payer: Self-pay

## 2019-11-20 VITALS — BP 108/64 | HR 72 | Resp 15 | Ht 64.0 in | Wt 171.0 lb

## 2019-11-20 DIAGNOSIS — R7681 Abnormal rheumatoid factor and anti-citrullinated protein antibody without rheumatoid arthritis: Secondary | ICD-10-CM

## 2019-11-20 DIAGNOSIS — R7989 Other specified abnormal findings of blood chemistry: Secondary | ICD-10-CM | POA: Diagnosis not present

## 2019-11-20 DIAGNOSIS — M25511 Pain in right shoulder: Secondary | ICD-10-CM

## 2019-11-20 DIAGNOSIS — Z8669 Personal history of other diseases of the nervous system and sense organs: Secondary | ICD-10-CM

## 2019-11-20 DIAGNOSIS — K219 Gastro-esophageal reflux disease without esophagitis: Secondary | ICD-10-CM

## 2019-11-20 DIAGNOSIS — J4541 Moderate persistent asthma with (acute) exacerbation: Secondary | ICD-10-CM

## 2019-11-20 DIAGNOSIS — R768 Other specified abnormal immunological findings in serum: Secondary | ICD-10-CM

## 2019-11-20 DIAGNOSIS — M791 Myalgia, unspecified site: Secondary | ICD-10-CM

## 2019-11-20 DIAGNOSIS — M2550 Pain in unspecified joint: Secondary | ICD-10-CM

## 2019-11-20 DIAGNOSIS — I471 Supraventricular tachycardia, unspecified: Secondary | ICD-10-CM

## 2019-11-20 DIAGNOSIS — M79642 Pain in left hand: Secondary | ICD-10-CM

## 2019-11-20 DIAGNOSIS — R5383 Other fatigue: Secondary | ICD-10-CM

## 2019-11-20 DIAGNOSIS — Z8639 Personal history of other endocrine, nutritional and metabolic disease: Secondary | ICD-10-CM

## 2019-11-20 DIAGNOSIS — M7062 Trochanteric bursitis, left hip: Secondary | ICD-10-CM

## 2019-11-20 DIAGNOSIS — M79641 Pain in right hand: Secondary | ICD-10-CM

## 2019-11-20 DIAGNOSIS — G8929 Other chronic pain: Secondary | ICD-10-CM

## 2019-11-20 DIAGNOSIS — D802 Selective deficiency of immunoglobulin A [IgA]: Secondary | ICD-10-CM

## 2019-11-20 DIAGNOSIS — R7689 Other specified abnormal immunological findings in serum: Secondary | ICD-10-CM

## 2019-11-20 DIAGNOSIS — M7061 Trochanteric bursitis, right hip: Secondary | ICD-10-CM

## 2019-11-20 DIAGNOSIS — Z8719 Personal history of other diseases of the digestive system: Secondary | ICD-10-CM

## 2019-11-20 NOTE — Patient Instructions (Addendum)
Iliotibial Band Syndrome Rehab Ask your health care provider which exercises are safe for you. Do exercises exactly as told by your health care provider and adjust them as directed. It is normal to feel mild stretching, pulling, tightness, or discomfort as you do these exercises. Stop right away if you feel sudden pain or your pain gets significantly worse. Do not begin these exercises until told by your health care provider. Stretching and range-of-motion exercises These exercises warm up your muscles and joints and improve the movement and flexibility of your hip and pelvis. Quadriceps stretch, prone  1. Lie on your abdomen on a firm surface, such as a bed or padded floor (prone position). 2. Bend your left / right knee and reach back to hold your ankle or pant leg. If you cannot reach your ankle or pant leg, loop a belt around your foot and grab the belt instead. 3. Gently pull your heel toward your buttocks. Your knee should not slide out to the side. You should feel a stretch in the front of your thigh and knee (quadriceps). 4. Hold this position for __________ seconds. Repeat __________ times. Complete this exercise __________ times a day. Iliotibial band stretch An iliotibial band is a strong band of muscle tissue that runs from the outer side of your hip to the outer side of your thigh and knee. 1. Lie on your side with your left / right leg in the top position. 2. Bend both of your knees and grab your left / right ankle. Stretch out your bottom arm to help you balance. 3. Slowly bring your top knee back so your thigh goes behind your trunk. 4. Slowly lower your top leg toward the floor until you feel a gentle stretch on the outside of your left / right hip and thigh. If you do not feel a stretch and your knee will not fall farther, place the heel of your other foot on top of your knee and pull your knee down toward the floor with your foot. 5. Hold this position for __________  seconds. Repeat __________ times. Complete this exercise __________ times a day. Strengthening exercises These exercises build strength and endurance in your hip and pelvis. Endurance is the ability to use your muscles for a long time, even after they get tired. Straight leg raises, side-lying This exercise strengthens the muscles that rotate the leg at the hip and move it away from your body (hip abductors). 1. Lie on your side with your left / right leg in the top position. Lie so your head, shoulder, hip, and knee line up. You may bend your bottom knee to help you balance. 2. Roll your hips slightly forward so your hips are stacked directly over each other and your left / right knee is facing forward. 3. Tense the muscles in your outer thigh and lift your top leg 4-6 inches (10-15 cm). 4. Hold this position for __________ seconds. 5. Slowly return to the starting position. Let your muscles relax completely before doing another repetition. Repeat __________ times. Complete this exercise __________ times a day. Leg raises, prone This exercise strengthens the muscles that move the hips (hip extensors). 1. Lie on your abdomen on your bed or a firm surface. You can put a pillow under your hips if that is more comfortable for your lower back. 2. Bend your left / right knee so your foot is straight up in the air. 3. Squeeze your buttocks muscles and lift your left / right thigh   off the bed. Do not let your back arch. 4. Tense your thigh muscle as hard as you can without increasing any knee pain. 5. Hold this position for __________ seconds. 6. Slowly lower your leg to the starting position and allow it to relax completely. Repeat __________ times. Complete this exercise __________ times a day. Hip hike 1. Stand sideways on a bottom step. Stand on your left / right leg with your other foot unsupported next to the step. You can hold on to the railing or wall for balance if needed. 2. Keep your knees  straight and your torso square. Then lift your left / right hip up toward the ceiling. 3. Slowly let your left / right hip lower toward the floor, past the starting position. Your foot should get closer to the floor. Do not lean or bend your knees. Repeat __________ times. Complete this exercise __________ times a day. This information is not intended to replace advice given to you by your health care provider. Make sure you discuss any questions you have with your health care provider. Document Revised: 07/19/2018 Document Reviewed: 01/17/2018 Elsevier Patient Education  [place]. Hand Exercises Hand exercises can be helpful for almost anyone. These exercises can strengthen the hands, improve flexibility and movement, and increase blood flow to the hands. These results can make work and daily tasks easier. Hand exercises can be especially helpful for people who have joint pain from arthritis or have nerve damage from overuse (carpal tunnel syndrome). These exercises can also help people who have injured a hand. Exercises Most of these hand exercises are gentle stretching and motion exercises. It is usually safe to do them often throughout the day. Warming up your hands before exercise may help to reduce stiffness. You can do this with gentle massage or by placing your hands in warm water for 10-15 minutes. It is normal to feel some stretching, pulling, tightness, or mild discomfort as you begin new exercises. This will gradually improve. Stop an exercise right away if you feel sudden, severe pain or your pain gets worse. Ask your health care provider which exercises are best for you. Knuckle bend or "claw" fist 1. Stand or sit with your arm, hand, and all five fingers pointed straight up. Make sure to keep your wrist straight during the exercise. 2. Gently bend your fingers down toward your palm until the tips of your fingers are touching the top of your palm. Keep your big knuckle straight  and just bend the small knuckles in your fingers. 3. Hold this position for __________ seconds. 4. Straighten (extend) your fingers back to the starting position. Repeat this exercise 5-10 times with each hand. Full finger fist 1. Stand or sit with your arm, hand, and all five fingers pointed straight up. Make sure to keep your wrist straight during the exercise. 2. Gently bend your fingers into your palm until the tips of your fingers are touching the middle of your palm. 3. Hold this position for __________ seconds. 4. Extend your fingers back to the starting position, stretching every joint fully. Repeat this exercise 5-10 times with each hand. Straight fist 1. Stand or sit with your arm, hand, and all five fingers pointed straight up. Make sure to keep your wrist straight during the exercise. 2. Gently bend your fingers at the big knuckle, where your fingers meet your hand, and the middle knuckle. Keep the knuckle at the tips of your fingers straight and try to touch the bottom of  your palm. 3. Hold this position for __________ seconds. 4. Extend your fingers back to the starting position, stretching every joint fully. Repeat this exercise 5-10 times with each hand. Tabletop 1. Stand or sit with your arm, hand, and all five fingers pointed straight up. Make sure to keep your wrist straight during the exercise. 2. Gently bend your fingers at the big knuckle, where your fingers meet your hand, as far down as you can while keeping the small knuckles in your fingers straight. Think of forming a tabletop with your fingers. 3. Hold this position for __________ seconds. 4. Extend your fingers back to the starting position, stretching every joint fully. Repeat this exercise 5-10 times with each hand. Finger spread 1. Place your hand flat on a table with your palm facing down. Make sure your wrist stays straight as you do this exercise. 2. Spread your fingers and thumb apart from each other as far  as you can until you feel a gentle stretch. Hold this position for __________ seconds. 3. Bring your fingers and thumb tight together again. Hold this position for __________ seconds. Repeat this exercise 5-10 times with each hand. Making circles 1. Stand or sit with your arm, hand, and all five fingers pointed straight up. Make sure to keep your wrist straight during the exercise. 2. Make a circle by touching the tip of your thumb to the tip of your index finger. 3. Hold for __________ seconds. Then open your hand wide. 4. Repeat this motion with your thumb and each finger on your hand. Repeat this exercise 5-10 times with each hand. Thumb motion 1. Sit with your forearm resting on a table and your wrist straight. Your thumb should be facing up toward the ceiling. Keep your fingers relaxed as you move your thumb. 2. Lift your thumb up as high as you can toward the ceiling. Hold for __________ seconds. 3. Bend your thumb across your palm as far as you can, reaching the tip of your thumb for the small finger (pinkie) side of your palm. Hold for __________ seconds. Repeat this exercise 5-10 times with each hand. Grip strengthening  1. Hold a stress ball or other soft ball in the middle of your hand. 2. Slowly increase the pressure, squeezing the ball as much as you can without causing pain. Think of bringing the tips of your fingers into the middle of your palm. All of your finger joints should bend when doing this exercise. 3. Hold your squeeze for __________ seconds, then relax. Repeat this exercise 5-10 times with each hand. Contact a health care provider if:  Your hand pain or discomfort gets much worse when you do an exercise.  Your hand pain or discomfort does not improve within 2 hours after you exercise. If you have any of these problems, stop doing these exercises right away. Do not do them again unless your health care provider says that you can. Get help right away if:  You  develop sudden, severe hand pain or swelling. If this happens, stop doing these exercises right away. Do not do them again unless your health care provider says that you can. This information is not intended to replace advice given to you by your health care provider. Make sure you discuss any questions you have with your health care provider. Document Revised: 07/19/2018 Document Reviewed: 03/29/2018 Elsevier Patient Education  2020 Elsevier

## 2019-11-21 ENCOUNTER — Encounter: Payer: Self-pay | Admitting: Family Medicine

## 2019-11-22 NOTE — Progress Notes (Signed)
I will discuss results at the follow-up visit.

## 2019-11-25 ENCOUNTER — Other Ambulatory Visit: Payer: 59

## 2019-11-26 ENCOUNTER — Telehealth: Payer: Self-pay | Admitting: Family Medicine

## 2019-11-26 ENCOUNTER — Ambulatory Visit
Admission: RE | Admit: 2019-11-26 | Discharge: 2019-11-26 | Disposition: A | Discharge status: Home / Self Care | Payer: 59 | Source: Ambulatory Visit | Attending: Family Medicine | Admitting: Family Medicine

## 2019-11-26 ENCOUNTER — Encounter: Payer: Self-pay | Admitting: Allergy and Immunology

## 2019-11-26 DIAGNOSIS — R110 Nausea: Secondary | ICD-10-CM

## 2019-11-26 NOTE — Telephone Encounter (Signed)
Ultrasound was negative

## 2019-11-30 LAB — 14-3-3 ETA PROTEIN: 14-3-3 eta Protein: <0.2 ng/mL (ref <0.2)

## 2019-11-30 LAB — PROTEIN ELECTROPHORESIS, SERUM, WITH REFLEX
Albumin ELP: 4.4 g/dL (ref 3.8–4.8)
Alpha 1: 0.2 g/dL (ref 0.2–0.3)
Alpha 2: 0.7 g/dL (ref 0.5–0.9)
Beta 2: 0.2 g/dL (ref 0.2–0.5)
Beta Globulin: 0.4 g/dL (ref 0.4–0.6)
Gamma Globulin: 1.6 g/dL (ref 0.8–1.7)
Total Protein: 7.6 g/dL (ref 6.1–8.1)

## 2019-11-30 LAB — COMPLETE METABOLIC PANEL WITH GFR
AG Ratio: 1.4 (calc) (ref 1.0–2.5)
ALT: 13 U/L (ref 6–29)
AST: 18 U/L (ref 10–35)
Albumin: 4.4 g/dL (ref 3.6–5.1)
Alkaline phosphatase (APISO): 57 U/L (ref 37–153)
BUN: 15 mg/dL (ref 7–25)
CO2: 26 mmol/L (ref 20–32)
Calcium: 9.3 mg/dL (ref 8.6–10.4)
Chloride: 104 mmol/L (ref 98–110)
Creat: 0.8 mg/dL (ref 0.50–1.05)
GFR, Est African American: 97 mL/min/{1.73_m2} (ref >=60)
GFR, Est Non African American: 84 mL/min/{1.73_m2} (ref >=60)
Globulin: 3.2 g/dL (calc) (ref 1.9–3.7)
Glucose, Bld: 72 mg/dL (ref 65–99)
Potassium: 4.3 mmol/L (ref 3.5–5.3)
Sodium: 139 mmol/L (ref 135–146)
Total Bilirubin: 0.5 mg/dL (ref 0.2–1.2)
Total Protein: 7.6 g/dL (ref 6.1–8.1)

## 2019-11-30 LAB — CBC WITH DIFFERENTIAL/PLATELET
Absolute Monocytes: 348 cells/uL (ref 200–950)
Basophils Absolute: 22 cells/uL (ref 0–200)
Basophils Relative: 0.5 %
Eosinophils Absolute: 90 cells/uL (ref 15–500)
Eosinophils Relative: 2.1 %
HCT: 43.3 % (ref 35.0–45.0)
Hemoglobin: 14.2 g/dL (ref 11.7–15.5)
Lymphs Abs: 1750 cells/uL (ref 850–3900)
MCH: 29.1 pg (ref 27.0–33.0)
MCHC: 32.8 g/dL (ref 32.0–36.0)
MCV: 88.7 fL (ref 80.0–100.0)
MPV: 11 fL (ref 7.5–12.5)
Monocytes Relative: 8.1 %
Neutro Abs: 2090 cells/uL (ref 1500–7800)
Neutrophils Relative %: 48.6 %
Platelets: 220 10*3/uL (ref 140–400)
RBC: 4.88 10*6/uL (ref 3.80–5.10)
RDW: 12.1 % (ref 11.0–15.0)
Total Lymphocyte: 40.7 %
WBC: 4.3 10*3/uL (ref 3.8–10.8)

## 2019-11-30 LAB — VITAMIN D 25 HYDROXY (VIT D DEFICIENCY, FRACTURES): Vit D, 25-Hydroxy: 35 ng/mL (ref 30–100)

## 2019-11-30 LAB — ANTI-DNA ANTIBODY, DOUBLE-STRANDED: ds DNA Ab: 2 IU/mL

## 2019-11-30 LAB — SJOGRENS SYNDROME-A EXTRACTABLE NUCLEAR ANTIBODY: SSA (Ro) (ENA) Antibody, IgG: <1 AI

## 2019-11-30 LAB — ANTI-SMITH ANTIBODY: ENA SM Ab Ser-aCnc: <1 AI

## 2019-11-30 LAB — RNP ANTIBODY: Ribonucleic Protein(ENA) Antibody, IgG: <1 AI

## 2019-11-30 LAB — CK: Total CK: 103 U/L (ref 29–143)

## 2019-11-30 LAB — SJOGRENS SYNDROME-B EXTRACTABLE NUCLEAR ANTIBODY: SSB (La) (ENA) Antibody, IgG: 4.9 AI — AB

## 2019-12-02 ENCOUNTER — Telehealth: Payer: Self-pay | Admitting: *Deleted

## 2019-12-02 NOTE — Telephone Encounter (Signed)
Letter for COVID booster.

## 2019-12-12 ENCOUNTER — Encounter (INDEPENDENT_AMBULATORY_CARE_PROVIDER_SITE_OTHER): Payer: Self-pay | Admitting: Family Medicine

## 2019-12-12 MED ORDER — TRIAMCINOLONE ACETONIDE 0.1 % MT PSTE: 1.0000 "application " | PASTE | Freq: Two times a day (BID) | OROMUCOSAL | 12 refills | Status: DC | PRN
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
## 2019-12-25 ENCOUNTER — Other Ambulatory Visit: Payer: Self-pay

## 2019-12-25 ENCOUNTER — Ambulatory Visit: Payer: 59 | Admitting: Internal Medicine

## 2019-12-25 ENCOUNTER — Encounter: Payer: Self-pay | Admitting: Internal Medicine

## 2019-12-25 ENCOUNTER — Other Ambulatory Visit: Payer: 59 | Admitting: Rheumatology

## 2019-12-25 VITALS — BP 120/74 | HR 66 | Temp 98.4°F | Ht 63.0 in | Wt 168.2 lb

## 2019-12-25 DIAGNOSIS — R0602 Shortness of breath: Secondary | ICD-10-CM | POA: Diagnosis not present

## 2019-12-25 DIAGNOSIS — J45909 Unspecified asthma, uncomplicated: Secondary | ICD-10-CM

## 2019-12-25 DIAGNOSIS — M069 Rheumatoid arthritis, unspecified: Secondary | ICD-10-CM | POA: Diagnosis not present

## 2019-12-25 NOTE — Patient Instructions (Signed)
The patient should have follow up scheduled with myself in 1 months.   Prior to next visit patient should have: Full set of PFTs FeNo
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
## 2019-12-30 ENCOUNTER — Encounter (INDEPENDENT_AMBULATORY_CARE_PROVIDER_SITE_OTHER): Payer: Self-pay | Admitting: Family Medicine

## 2019-12-30 MED ORDER — AMOXICILLIN-POT CLAVULANATE 875-125 MG PO TABS: 1.0000 | ORAL_TABLET | Freq: Two times a day (BID) | ORAL | 0 refills | Status: DC

## 2019-12-31 ENCOUNTER — Encounter: Payer: Self-pay | Admitting: Allergy and Immunology

## 2019-12-31 ENCOUNTER — Other Ambulatory Visit: Payer: Self-pay

## 2019-12-31 ENCOUNTER — Ambulatory Visit: Payer: 59 | Admitting: Allergy and Immunology

## 2019-12-31 VITALS — BP 110/72 | HR 69 | Temp 98.2°F | Resp 16

## 2019-12-31 DIAGNOSIS — D802 Selective deficiency of immunoglobulin A [IgA]: Secondary | ICD-10-CM

## 2019-12-31 DIAGNOSIS — J3089 Other allergic rhinitis: Secondary | ICD-10-CM

## 2019-12-31 DIAGNOSIS — E8801 Alpha-1-antitrypsin deficiency: Secondary | ICD-10-CM

## 2019-12-31 DIAGNOSIS — K219 Gastro-esophageal reflux disease without esophagitis: Secondary | ICD-10-CM | POA: Diagnosis not present

## 2019-12-31 DIAGNOSIS — J4540 Moderate persistent asthma, uncomplicated: Secondary | ICD-10-CM

## 2019-12-31 DIAGNOSIS — Z91030 Bee allergy status: Secondary | ICD-10-CM

## 2019-12-31 DIAGNOSIS — Z91038 Other insect allergy status: Secondary | ICD-10-CM

## 2019-12-31 NOTE — Patient Instructions (Addendum)
  1. Continue to Treat and prevent inflammation:   A. Nasacort 1-2 sprays each nostril 1-7 times per week  B. Montelukast 10mg  1 tablet 1 time per day  C. Dupilumab injections every 2 weeks  2. Continue to treat and prevent reflux:    A.  Generic Omeprazole 40 mg + bicarbonate twice a day  3.  If needed:   A. ipratropium 0.06% 2 sprays each nostril every 6 hours   B. nasal saline wash  C. pro-air respiclick or albuterol nebulization  D. over-the-counter antihistamine / Mucinex DM   E. EpiPen / Auvi-Q 0.3  4. Can restart Symbicort 160 - 2 inhalations 1- 2 times per day during increased asthma activity  5. For this recent infection / inflammatory flare use the following:   A. Complete 5 days of Augmentin prescription  B. Prednisone 10 mg - 1 tablet 2 times per day for 5 days  6. Return to clinic 6 months or earlier if problem
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
## 2020-01-01 ENCOUNTER — Ambulatory Visit: Payer: 59 | Admitting: Orthopedic Surgery

## 2020-01-01 ENCOUNTER — Encounter: Payer: Self-pay | Admitting: Orthopedic Surgery

## 2020-01-01 ENCOUNTER — Encounter: Payer: Self-pay | Admitting: Allergy and Immunology

## 2020-01-01 DIAGNOSIS — S46011D Strain of muscle(s) and tendon(s) of the rotator cuff of right shoulder, subsequent encounter: Secondary | ICD-10-CM | POA: Diagnosis not present
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
## 2020-01-02 ENCOUNTER — Ambulatory Visit: Payer: 59 | Admitting: Rheumatology

## 2020-01-02 ENCOUNTER — Ambulatory Visit: Payer: Self-pay

## 2020-01-02 ENCOUNTER — Encounter: Payer: Self-pay | Admitting: Rheumatology

## 2020-01-02 ENCOUNTER — Other Ambulatory Visit: Payer: Self-pay

## 2020-01-02 VITALS — BP 104/69 | HR 73 | Resp 14 | Ht 63.0 in | Wt 172.8 lb

## 2020-01-02 DIAGNOSIS — Z7189 Other specified counseling: Secondary | ICD-10-CM

## 2020-01-02 DIAGNOSIS — J4541 Moderate persistent asthma with (acute) exacerbation: Secondary | ICD-10-CM

## 2020-01-02 DIAGNOSIS — M7061 Trochanteric bursitis, right hip: Secondary | ICD-10-CM

## 2020-01-02 DIAGNOSIS — D802 Selective deficiency of immunoglobulin A [IgA]: Secondary | ICD-10-CM

## 2020-01-02 DIAGNOSIS — M25511 Pain in right shoulder: Secondary | ICD-10-CM

## 2020-01-02 DIAGNOSIS — M19042 Primary osteoarthritis, left hand: Secondary | ICD-10-CM

## 2020-01-02 DIAGNOSIS — M059 Rheumatoid arthritis with rheumatoid factor, unspecified: Secondary | ICD-10-CM

## 2020-01-02 DIAGNOSIS — M65842 Other synovitis and tenosynovitis, left hand: Secondary | ICD-10-CM | POA: Diagnosis not present

## 2020-01-02 DIAGNOSIS — K219 Gastro-esophageal reflux disease without esophagitis: Secondary | ICD-10-CM

## 2020-01-02 DIAGNOSIS — G8929 Other chronic pain: Secondary | ICD-10-CM

## 2020-01-02 DIAGNOSIS — M79641 Pain in right hand: Secondary | ICD-10-CM

## 2020-01-02 DIAGNOSIS — M7062 Trochanteric bursitis, left hip: Secondary | ICD-10-CM

## 2020-01-02 DIAGNOSIS — M3509 Sicca syndrome with other organ involvement: Secondary | ICD-10-CM | POA: Diagnosis not present

## 2020-01-02 DIAGNOSIS — Z8669 Personal history of other diseases of the nervous system and sense organs: Secondary | ICD-10-CM

## 2020-01-02 DIAGNOSIS — Z8639 Personal history of other endocrine, nutritional and metabolic disease: Secondary | ICD-10-CM

## 2020-01-02 DIAGNOSIS — M19041 Primary osteoarthritis, right hand: Secondary | ICD-10-CM

## 2020-01-02 DIAGNOSIS — Z79899 Other long term (current) drug therapy: Secondary | ICD-10-CM

## 2020-01-02 DIAGNOSIS — I471 Supraventricular tachycardia: Secondary | ICD-10-CM

## 2020-01-02 DIAGNOSIS — M65841 Other synovitis and tenosynovitis, right hand: Secondary | ICD-10-CM | POA: Diagnosis not present

## 2020-01-02 DIAGNOSIS — M797 Fibromyalgia: Secondary | ICD-10-CM

## 2020-01-02 DIAGNOSIS — M79642 Pain in left hand: Secondary | ICD-10-CM

## 2020-01-02 DIAGNOSIS — Z8719 Personal history of other diseases of the digestive system: Secondary | ICD-10-CM

## 2020-01-02 MED ORDER — HYDROXYCHLOROQUINE SULFATE 200 MG PO TABS: ORAL_TABLET | ORAL | 0 refills | Status: DC

## 2020-01-02 NOTE — Patient Instructions (Signed)
Hydroxychloroquine tablets What is this medicine? HYDROXYCHLOROQUINE (hye drox ee KLOR oh kwin) is used to treat rheumatoid arthritis and systemic lupus erythematosus. It is also used to treat malaria. This medicine may be used for other purposes; ask your health care provider or pharmacist if you have questions. COMMON BRAND NAME(S): Plaquenil, Quineprox What should I tell my health care provider before I take this medicine? They need to know if you have any of these conditions:  diabetes  eye disease, vision problems  G6PD deficiency  heart disease  history of irregular heartbeat  if you often drink alcohol  kidney disease  liver disease  porphyria  psoriasis  an unusual or allergic reaction to chloroquine, hydroxychloroquine, other medicines, foods, dyes, or preservatives  pregnant or trying to get pregnant  breast-feeding How should I use this medicine? Take this medicine by mouth with a glass of water. Follow the directions on the prescription label. Do not cut, crush or chew this medicine. Swallow the tablets whole. Take this medicine with food. Avoid taking antacids within 4 hours of taking this medicine. It is best to separate these medicines by at least 4 hours. Take your medicine at regular intervals. Do not take it more often than directed. Take all of your medicine as directed even if you think you are better. Do not skip doses or stop your medicine early. Talk to your pediatrician regarding the use of this medicine in children. While this drug may be prescribed for selected conditions, precautions do apply. Overdosage: If you think you have taken too much of this medicine contact a poison control center or emergency room at once. NOTE: This medicine is only for you. Do not share this medicine with others. What if I miss a dose? If you miss a dose, take it as soon as you can. If it is almost time for your next dose, take only that dose. Do not take double or extra  doses. What may interact with this medicine? Do not take this medicine with any of the following medications:  cisapride  dronedarone  pimozide  thioridazine This medicine may also interact with the following medications:  ampicillin  antacids  cimetidine  cyclosporine  digoxin  kaolin  medicines for diabetes, like insulin, glipizide, glyburide  medicines for seizures like carbamazepine, phenobarbital, phenytoin  mefloquine  methotrexate  other medicines that prolong the QT interval (cause an abnormal heart rhythm)  praziquantel This list may not describe all possible interactions. Give your health care provider a list of all the medicines, herbs, non-prescription drugs, or dietary supplements you use. Also tell them if you smoke, drink alcohol, or use illegal drugs. Some items may interact with your medicine. What should I watch for while using this medicine? Visit your health care professional for regular checks on your progress. Tell your health care professional if your symptoms do not start to get better or if they get worse. You may need blood work done while you are taking this medicine. If you take other medicines that can affect heart rhythm, you may need more testing. Talk to your health care professional if you have questions. Your vision may be tested before and during use of this medicine. Tell your health care professional right away if you have any change in your eyesight. What side effects may I notice from receiving this medicine? Side effects that you should report to your doctor or health care professional as soon as possible:  allergic reactions like skin rash, itching or hives,   swelling of the face, lips, or tongue  changes in vision  decreased hearing or ringing of the ears  muscle weakness  redness, blistering, peeling or loosening of the skin, including inside the mouth  sensitivity to light  signs and symptoms of a dangerous change in  heartbeat or heart rhythm like chest pain; dizziness; fast or irregular heartbeat; palpitations; feeling faint or lightheaded, falls; breathing problems  signs and symptoms of liver injury like dark yellow or brown urine; general ill feeling or flu-like symptoms; light-colored stools; loss of appetite; nausea; right upper belly pain; unusually weak or tired; yellowing of the eyes or skin  signs and symptoms of low blood sugar such as feeling anxious; confusion; dizziness; increased hunger; unusually weak or tired; sweating; shakiness; cold; irritable; headache; blurred vision; fast heartbeat; loss of consciousness  suicidal thoughts  uncontrollable head, mouth, neck, arm, or leg movements Side effects that usually do not require medical attention (report to your doctor or health care professional if they continue or are bothersome):  diarrhea  dizziness  hair loss  headache  irritable  loss of appetite  nausea, vomiting  stomach pain This list may not describe all possible side effects. Call your doctor for medical advice about side effects. You may report side effects to FDA at 1-800-FDA-1088. Where should I keep my medicine? Keep out of the reach of children. Store at room temperature between 15 and 30 degrees C (59 and 86 degrees F). Protect from moisture and light. Throw away any unused medicine after the expiration date. NOTE: This sheet is a summary. It may not cover all possible information. If you have questions about this medicine, talk to your doctor, pharmacist, or health care provider.  2020 Elsevier/Gold Standard (2018-08-06 12:56:32)  Standing Labs We placed an order today for your standing lab work.   Please have your standing labs drawn in 1 month, 3 months and then every 5 months If possible, please have your labs drawn 2 weeks prior to your appointment so that the provider can discuss your results at your appointment.  We have open lab daily Monday through  Thursday from 8:30-12:30 PM and 1:30-4:30 PM and Friday from 8:30-12:30 PM and 1:30-4:00 PM at the office of Dr. Pollyann Savoy, [facility].   Please be advised, patients with office appointments requiring lab work will take precedents over walk-in lab work.  If possible, please come for your lab work on Monday and Friday afternoons, as you may experience shorter wait times. The office is located at [address], [address], [place], [place] [address] No appointment is necessary.   Labs are drawn by [facility]. Please bring your co-pay at the time of your lab draw.  You may receive a bill from [facility] for your lab work.  If you wish to have your labs drawn at another location, please call the office 24 hours in advance to send orders.  If you have any questions regarding directions or hours of operation,  please call [phone].   As a reminder, please drink plenty of water prior to coming for your lab work. Thanks!

## 2020-01-03 ENCOUNTER — Telehealth: Payer: Self-pay | Admitting: *Deleted

## 2020-01-03 NOTE — Telephone Encounter (Signed)
Faxed order as requested by [facility] Mammogram for [place] Mammogram for 6 month f/u possible breast mass left. Pt has appt scheduled on 01/09/20 at 230p

## 2020-01-06 ENCOUNTER — Other Ambulatory Visit: Payer: Self-pay | Admitting: Allergy and Immunology

## 2020-01-08 ENCOUNTER — Encounter: Payer: Self-pay | Admitting: Orthopedic Surgery

## 2020-01-09 ENCOUNTER — Other Ambulatory Visit: Payer: Self-pay | Admitting: Allergy and Immunology

## 2020-01-09 MED ORDER — MELOXICAM 15 MG PO TABS: ORAL_TABLET | ORAL | 0 refills | Status: DC

## 2020-01-09 NOTE — Telephone Encounter (Signed)
She should do Mobic 15 mg p.o. daily for 7 days and then not lift anything particularly overhead for 2 weeks and about that should take care of it if not then she should come in and we can look at it thanks

## 2020-01-29 ENCOUNTER — Ambulatory Visit (INDEPENDENT_AMBULATORY_CARE_PROVIDER_SITE_OTHER): Payer: 59 | Admitting: Internal Medicine

## 2020-01-29 ENCOUNTER — Encounter: Payer: Self-pay | Admitting: Internal Medicine

## 2020-01-29 ENCOUNTER — Ambulatory Visit: Payer: 59 | Admitting: Internal Medicine

## 2020-01-29 ENCOUNTER — Other Ambulatory Visit: Payer: Self-pay

## 2020-01-29 VITALS — BP 110/68 | HR 71 | Temp 97.2°F | Ht 64.0 in | Wt 172.0 lb

## 2020-01-29 DIAGNOSIS — J45909 Unspecified asthma, uncomplicated: Secondary | ICD-10-CM | POA: Diagnosis not present

## 2020-01-29 DIAGNOSIS — R0602 Shortness of breath: Secondary | ICD-10-CM

## 2020-01-29 DIAGNOSIS — I471 Supraventricular tachycardia: Secondary | ICD-10-CM

## 2020-01-29 DIAGNOSIS — J4550 Severe persistent asthma, uncomplicated: Secondary | ICD-10-CM | POA: Diagnosis not present

## 2020-01-29 LAB — PULMONARY FUNCTION TEST
DL/VA % pred: 117 %
DL/VA: 5 ml/min/mmHg/L
DLCO cor % pred: 115 %
DLCO cor: 23.89 ml/min/mmHg
DLCO unc % pred: 115 %
DLCO unc: 23.89 ml/min/mmHg
FEF 25-75 Post: 1.19 L/sec
FEF 25-75 Pre: 1.07 L/sec
FEF2575-%Change-Post: 11 %
FEF2575-%Pred-Post: 45 %
FEF2575-%Pred-Pre: 41 %
FEV1-%Change-Post: 6 %
FEV1-%Pred-Post: 72 %
FEV1-%Pred-Pre: 68 %
FEV1-Post: 1.97 L
FEV1-Pre: 1.85 L
FEV1FVC-%Change-Post: 7 %
FEV1FVC-%Pred-Pre: 79 %
FEV6-%Change-Post: 0 %
FEV6-%Pred-Post: 85 %
FEV6-%Pred-Pre: 86 %
FEV6-Post: 2.88 L
FEV6-Pre: 2.89 L
FEV6FVC-%Change-Post: 1 %
FEV6FVC-%Pred-Post: 103 %
FEV6FVC-%Pred-Pre: 102 %
FVC-%Change-Post: -1 %
FVC-%Pred-Post: 83 %
FVC-%Pred-Pre: 84 %
FVC-Post: 2.88 L
FVC-Pre: 2.92 L
Post FEV1/FVC ratio: 68 %
Post FEV6/FVC ratio: 100 %
Pre FEV1/FVC ratio: 63 %
Pre FEV6/FVC Ratio: 99 %
RV % pred: 106 %
RV: 1.99 L
TLC % pred: 98 %
TLC: 4.95 L
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
## 2020-01-29 NOTE — Progress Notes (Signed)
PFT done today. 

## 2020-01-29 NOTE — Patient Instructions (Signed)
The patient should have follow up scheduled with myself in 4 months.   Follow up with the heart doctors - I have put in a referral today.
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
## 2020-02-18 ENCOUNTER — Ambulatory Visit: Payer: 59 | Admitting: Internal Medicine

## 2020-02-20 ENCOUNTER — Institutional Professional Consult (permissible substitution): Payer: 59 | Admitting: Internal Medicine

## 2020-02-20 ENCOUNTER — Other Ambulatory Visit: Payer: Self-pay

## 2020-02-20 ENCOUNTER — Encounter: Payer: Self-pay | Admitting: Rheumatology

## 2020-02-20 ENCOUNTER — Ambulatory Visit: Payer: 59 | Admitting: Rheumatology

## 2020-02-20 VITALS — BP 115/74 | HR 65 | Ht 63.0 in | Wt 179.6 lb

## 2020-02-20 DIAGNOSIS — M19042 Primary osteoarthritis, left hand: Secondary | ICD-10-CM

## 2020-02-20 DIAGNOSIS — J4541 Moderate persistent asthma with (acute) exacerbation: Secondary | ICD-10-CM

## 2020-02-20 DIAGNOSIS — M19041 Primary osteoarthritis, right hand: Secondary | ICD-10-CM

## 2020-02-20 DIAGNOSIS — M25511 Pain in right shoulder: Secondary | ICD-10-CM | POA: Diagnosis not present

## 2020-02-20 DIAGNOSIS — K219 Gastro-esophageal reflux disease without esophagitis: Secondary | ICD-10-CM

## 2020-02-20 DIAGNOSIS — I471 Supraventricular tachycardia: Secondary | ICD-10-CM

## 2020-02-20 DIAGNOSIS — M7062 Trochanteric bursitis, left hip: Secondary | ICD-10-CM

## 2020-02-20 DIAGNOSIS — Z8669 Personal history of other diseases of the nervous system and sense organs: Secondary | ICD-10-CM

## 2020-02-20 DIAGNOSIS — Z8719 Personal history of other diseases of the digestive system: Secondary | ICD-10-CM

## 2020-02-20 DIAGNOSIS — M797 Fibromyalgia: Secondary | ICD-10-CM

## 2020-02-20 DIAGNOSIS — M059 Rheumatoid arthritis with rheumatoid factor, unspecified: Secondary | ICD-10-CM

## 2020-02-20 DIAGNOSIS — M3509 Sicca syndrome with other organ involvement: Secondary | ICD-10-CM

## 2020-02-20 DIAGNOSIS — G8929 Other chronic pain: Secondary | ICD-10-CM

## 2020-02-20 DIAGNOSIS — Z79899 Other long term (current) drug therapy: Secondary | ICD-10-CM

## 2020-02-20 DIAGNOSIS — D802 Selective deficiency of immunoglobulin A [IgA]: Secondary | ICD-10-CM

## 2020-02-20 DIAGNOSIS — Z8639 Personal history of other endocrine, nutritional and metabolic disease: Secondary | ICD-10-CM

## 2020-02-20 DIAGNOSIS — M7061 Trochanteric bursitis, right hip: Secondary | ICD-10-CM

## 2020-02-20 NOTE — Patient Instructions (Signed)
Standing Labs We placed an order today for your standing lab work.   Please have your standing labs drawn in February and every 5 months  If possible, please have your labs drawn 2 weeks prior to your appointment so that the provider can discuss your results at your appointment.  We have open lab daily Monday through Thursday from 8:30-12:30 PM and 1:30-4:30 PM and Friday from 8:30-12:30 PM and 1:30-4:00 PM at the office of Dr. Pollyann Savoy, [facility].   Please be advised, patients with office appointments requiring lab work will take precedents over walk-in lab work.  If possible, please come for your lab work on Monday and Friday afternoons, as you may experience shorter wait times. The office is located at [address], [address], [place], [place] [address] No appointment is necessary.   Labs are drawn by [facility]. Please bring your co-pay at the time of your lab draw.  You may receive a bill from [facility] for your lab work.  If you wish to have your labs drawn at another location, please call the office 24 hours in advance to send orders.  If you have any questions regarding directions or hours of operation,  please call [phone].   As a reminder, please drink plenty of water prior to coming for your lab work. Thanks!

## 2020-02-21 ENCOUNTER — Ambulatory Visit: Payer: 59 | Admitting: Rheumatology

## 2020-02-21 LAB — CBC WITH DIFFERENTIAL/PLATELET
Absolute Monocytes: 378 cells/uL (ref 200–950)
Basophils Absolute: 9 cells/uL (ref 0–200)
Basophils Relative: 0.2 %
Eosinophils Absolute: 60 cells/uL (ref 15–500)
Eosinophils Relative: 1.4 %
HCT: 40.9 % (ref 35.0–45.0)
Hemoglobin: 13.1 g/dL (ref 11.7–15.5)
Lymphs Abs: 1518 cells/uL (ref 850–3900)
MCH: 28.5 pg (ref 27.0–33.0)
MCHC: 32 g/dL (ref 32.0–36.0)
MCV: 88.9 fL (ref 80.0–100.0)
MPV: 11.2 fL (ref 7.5–12.5)
Monocytes Relative: 8.8 %
Neutro Abs: 2335 cells/uL (ref 1500–7800)
Neutrophils Relative %: 54.3 %
Platelets: 187 10*3/uL (ref 140–400)
RBC: 4.6 10*6/uL (ref 3.80–5.10)
RDW: 12 % (ref 11.0–15.0)
Total Lymphocyte: 35.3 %
WBC: 4.3 10*3/uL (ref 3.8–10.8)

## 2020-02-21 LAB — COMPLETE METABOLIC PANEL WITH GFR
AG Ratio: 1.6 (calc) (ref 1.0–2.5)
ALT: 17 U/L (ref 6–29)
AST: 21 U/L (ref 10–35)
Albumin: 4.4 g/dL (ref 3.6–5.1)
Alkaline phosphatase (APISO): 56 U/L (ref 37–153)
BUN: 14 mg/dL (ref 7–25)
CO2: 30 mmol/L (ref 20–32)
Calcium: 9.1 mg/dL (ref 8.6–10.4)
Chloride: 103 mmol/L (ref 98–110)
Creat: 0.76 mg/dL (ref 0.50–1.05)
GFR, Est African American: 103 mL/min/{1.73_m2} (ref >=60)
GFR, Est Non African American: 89 mL/min/{1.73_m2} (ref >=60)
Globulin: 2.7 g/dL (calc) (ref 1.9–3.7)
Glucose, Bld: 62 mg/dL — ABNORMAL LOW (ref 65–139)
Potassium: 4.2 mmol/L (ref 3.5–5.3)
Sodium: 140 mmol/L (ref 135–146)
Total Bilirubin: 0.4 mg/dL (ref 0.2–1.2)
Total Protein: 7.1 g/dL (ref 6.1–8.1)

## 2020-02-21 NOTE — Progress Notes (Signed)
CBC and CMP are normal.

## 2020-02-25 ENCOUNTER — Ambulatory Visit: Payer: 59 | Admitting: Allergy and Immunology

## 2020-02-25 ENCOUNTER — Other Ambulatory Visit: Payer: Self-pay | Admitting: Radiology

## 2020-02-25 DIAGNOSIS — M3509 Sicca syndrome with other organ involvement: Secondary | ICD-10-CM

## 2020-02-25 MED ORDER — HYDROXYCHLOROQUINE SULFATE 200 MG PO TABS: ORAL_TABLET | ORAL | 2 refills | Status: DC

## 2020-02-25 NOTE — Telephone Encounter (Signed)
Last Visit: 02/20/2020 Next Visit: 05/21/2020 Labs: 02/20/2020 CBC and CMP are normal Eye exam: No eye exam on file - patient started taking PLQ 12/2019 and has PLQ eye exam scheduled on 03/09/2020  Current Dose per office note 02/20/2020: Plaquenil 200 mg p.o. daily DX: Sjogren's syndrome with other organ involvement  Okay to refill Plaquenil?

## 2020-02-28 ENCOUNTER — Encounter (INDEPENDENT_AMBULATORY_CARE_PROVIDER_SITE_OTHER): Payer: Self-pay | Admitting: Family Medicine

## 2020-02-28 MED ORDER — AZITHROMYCIN 250 MG PO TABS: ORAL_TABLET | ORAL | 0 refills | Status: DC

## 2020-03-16 ENCOUNTER — Other Ambulatory Visit: Payer: Self-pay

## 2020-03-16 ENCOUNTER — Encounter: Payer: Self-pay | Admitting: Internal Medicine

## 2020-03-16 ENCOUNTER — Ambulatory Visit: Payer: 59 | Admitting: Internal Medicine

## 2020-03-16 VITALS — BP 126/82 | HR 61 | Ht 63.0 in | Wt 175.8 lb

## 2020-03-16 DIAGNOSIS — R0602 Shortness of breath: Secondary | ICD-10-CM | POA: Diagnosis not present

## 2020-03-16 DIAGNOSIS — I471 Supraventricular tachycardia: Secondary | ICD-10-CM

## 2020-03-16 NOTE — Patient Instructions (Addendum)
Medication Instructions:  Your physician recommends that you continue on your current medications as directed. Please refer to the Current Medication list given to you today.  *If you need a refill on your cardiac medications before your next appointment, please call your pharmacy*  Lab Work: None ordered.  If you have labs (blood work) drawn today and your tests are completely normal, you will receive your results only by: Marland Kitchen MyChart Message (if you have MyChart) OR . A paper copy in the mail If you have any lab test that is abnormal or we need to change your treatment, we will call you to review the results.  Testing/Procedures: Please schedule Echo and Andy in 2 months. Your physician has requested that you have an echocardiogram. Echocardiography is a painless test that uses sound waves to create images of your heart. It provides your doctor with information about the size and shape of your heart and how well your heart's chambers and valves are working. This procedure takes approximately one hour. There are no restrictions for this procedure.   Follow-Up: At [facility], you and your health needs are our priority.  As part of our continuing mission to provide you with exceptional heart care, we have created designated Provider Care Teams.  These Care Teams include your primary Cardiologist (physician) and Advanced Practice Providers (APPs -  Physician Assistants and Nurse Practitioners) who all work together to provide you with the care you need, when you need it.  We recommend signing up for the patient portal called "MyChart".  Sign up information is provided on this After Visit Summary.  MyChart is used to connect with patients for Virtual Visits (Telemedicine).  Patients are able to view lab/test results, encounter notes, upcoming appointments, etc.  Non-urgent messages can be sent to your provider as well.   To learn more about what you can do with MyChart, go to  [email or web address].    Your next appointment:   Your physician wants you to follow-up in: 2 months with Otilio Saber.    Other Instructions:
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
## 2020-03-19 ENCOUNTER — Ambulatory Visit: Payer: 59 | Admitting: Rheumatology

## 2020-03-20 ENCOUNTER — Other Ambulatory Visit: Payer: Self-pay | Admitting: Allergy and Immunology

## 2020-04-08 ENCOUNTER — Ambulatory Visit (HOSPITAL_COMMUNITY): Payer: 59 | Attending: Internal Medicine

## 2020-04-08 ENCOUNTER — Other Ambulatory Visit: Payer: Self-pay

## 2020-04-08 DIAGNOSIS — I471 Supraventricular tachycardia: Secondary | ICD-10-CM | POA: Diagnosis not present

## 2020-04-08 DIAGNOSIS — R0602 Shortness of breath: Secondary | ICD-10-CM

## 2020-04-08 LAB — ECHOCARDIOGRAM COMPLETE
Area-P 1/2: 3.99 cm2
S' Lateral: 3.25 cm

## 2020-04-16 ENCOUNTER — Other Ambulatory Visit (HOSPITAL_COMMUNITY): Payer: Self-pay | Admitting: *Deleted

## 2020-04-16 ENCOUNTER — Other Ambulatory Visit: Payer: Self-pay

## 2020-04-16 ENCOUNTER — Ambulatory Visit (HOSPITAL_COMMUNITY): Payer: 59 | Attending: Internal Medicine

## 2020-04-16 DIAGNOSIS — R0602 Shortness of breath: Secondary | ICD-10-CM | POA: Diagnosis not present

## 2020-04-16 DIAGNOSIS — R0600 Dyspnea, unspecified: Secondary | ICD-10-CM | POA: Diagnosis not present

## 2020-04-16 DIAGNOSIS — I471 Supraventricular tachycardia: Secondary | ICD-10-CM | POA: Diagnosis not present

## 2020-04-22 ENCOUNTER — Encounter: Payer: Self-pay | Admitting: Orthopedic Surgery

## 2020-04-22 DIAGNOSIS — S46011D Strain of muscle(s) and tendon(s) of the rotator cuff of right shoulder, subsequent encounter: Secondary | ICD-10-CM

## 2020-04-23 NOTE — Telephone Encounter (Signed)
Sure

## 2020-05-06 ENCOUNTER — Encounter (INDEPENDENT_AMBULATORY_CARE_PROVIDER_SITE_OTHER): Payer: Self-pay | Admitting: Family Medicine

## 2020-05-06 MED ORDER — AMOXICILLIN-POT CLAVULANATE 875-125 MG PO TABS: 1.0000 | ORAL_TABLET | Freq: Two times a day (BID) | ORAL | 0 refills | Status: DC

## 2020-05-06 MED ORDER — AZITHROMYCIN 250 MG PO TABS: ORAL_TABLET | ORAL | 0 refills | Status: DC

## 2020-05-06 NOTE — Addendum Note (Signed)
Addended by: Lillia Carmel on: 05/06/2020 05:05 PM   Modules accepted: Orders

## 2020-05-08 ENCOUNTER — Other Ambulatory Visit: Payer: Self-pay | Admitting: Allergy and Immunology
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
## 2020-05-14 ENCOUNTER — Other Ambulatory Visit: Payer: Self-pay | Admitting: *Deleted

## 2020-05-14 DIAGNOSIS — Z79899 Other long term (current) drug therapy: Secondary | ICD-10-CM

## 2020-05-14 NOTE — Telephone Encounter (Signed)
Reviewed results with patient at length.  She was thankful for the call and has no further questions at this time.   Peggy Wallace [address] [place], [place]  05/14/2020 3:33 PM

## 2020-05-14 NOTE — Telephone Encounter (Signed)
Please advise on how this works if you have any information    I was notified that Dr. Celine Mans is no longer in my insurance network with [facility].  Is this correct?  If so, I will not be able to continue seeing her unfortunately.

## 2020-05-15 ENCOUNTER — Encounter: Payer: Self-pay | Admitting: Family Medicine

## 2020-05-15 ENCOUNTER — Encounter: Payer: Self-pay | Admitting: Orthopedic Surgery

## 2020-05-15 LAB — CBC WITH DIFFERENTIAL/PLATELET
Absolute Monocytes: 425 cells/uL (ref 200–950)
Basophils Absolute: 10 cells/uL (ref 0–200)
Basophils Relative: 0.2 %
Eosinophils Absolute: 70 cells/uL (ref 15–500)
Eosinophils Relative: 1.4 %
HCT: 40.9 % (ref 35.0–45.0)
Hemoglobin: 14 g/dL (ref 11.7–15.5)
Lymphs Abs: 2085 cells/uL (ref 850–3900)
MCH: 29.5 pg (ref 27.0–33.0)
MCHC: 34.2 g/dL (ref 32.0–36.0)
MCV: 86.1 fL (ref 80.0–100.0)
MPV: 10.9 fL (ref 7.5–12.5)
Monocytes Relative: 8.5 %
Neutro Abs: 2410 cells/uL (ref 1500–7800)
Neutrophils Relative %: 48.2 %
Platelets: 221 10*3/uL (ref 140–400)
RBC: 4.75 10*6/uL (ref 3.80–5.10)
RDW: 12.1 % (ref 11.0–15.0)
Total Lymphocyte: 41.7 %
WBC: 5 10*3/uL (ref 3.8–10.8)

## 2020-05-15 LAB — COMPLETE METABOLIC PANEL WITH GFR
AG Ratio: 1.6 (calc) (ref 1.0–2.5)
ALT: 11 U/L (ref 6–29)
AST: 20 U/L (ref 10–35)
Albumin: 4.5 g/dL (ref 3.6–5.1)
Alkaline phosphatase (APISO): 52 U/L (ref 37–153)
BUN: 20 mg/dL (ref 7–25)
CO2: 26 mmol/L (ref 20–32)
Calcium: 9.2 mg/dL (ref 8.6–10.4)
Chloride: 102 mmol/L (ref 98–110)
Creat: 0.91 mg/dL (ref 0.50–1.05)
GFR, Est African American: 82 mL/min/{1.73_m2} (ref >=60)
GFR, Est Non African American: 71 mL/min/{1.73_m2} (ref >=60)
Globulin: 2.9 g/dL (calc) (ref 1.9–3.7)
Glucose, Bld: 82 mg/dL (ref 65–99)
Potassium: 5.1 mmol/L (ref 3.5–5.3)
Sodium: 138 mmol/L (ref 135–146)
Total Bilirubin: 0.4 mg/dL (ref 0.2–1.2)
Total Protein: 7.4 g/dL (ref 6.1–8.1)

## 2020-05-15 NOTE — Progress Notes (Signed)
CBC and CMP are normal.

## 2020-05-15 NOTE — Telephone Encounter (Signed)
Please set up intra-articular injection with Peggy Wallace for sometime next week. Thanks. She still has a little bit of frozen shoulder left spine I think she is far enough out that we could consider an injection to help her get her range of motion back.

## 2020-05-18 ENCOUNTER — Ambulatory Visit: Payer: 59 | Admitting: Family Medicine

## 2020-05-18 ENCOUNTER — Encounter: Payer: Self-pay | Admitting: Family Medicine

## 2020-05-18 ENCOUNTER — Other Ambulatory Visit: Payer: Self-pay

## 2020-05-18 ENCOUNTER — Ambulatory Visit: Payer: Self-pay

## 2020-05-18 DIAGNOSIS — S46011D Strain of muscle(s) and tendon(s) of the rotator cuff of right shoulder, subsequent encounter: Secondary | ICD-10-CM
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
## 2020-05-21 ENCOUNTER — Encounter: Payer: Self-pay | Admitting: Physician Assistant

## 2020-05-21 ENCOUNTER — Ambulatory Visit: Payer: 59 | Admitting: Physician Assistant

## 2020-05-21 ENCOUNTER — Other Ambulatory Visit: Payer: Self-pay

## 2020-05-21 VITALS — BP 101/66 | HR 75 | Resp 16 | Ht 64.0 in | Wt 180.0 lb

## 2020-05-21 DIAGNOSIS — I471 Supraventricular tachycardia: Secondary | ICD-10-CM

## 2020-05-21 DIAGNOSIS — M7061 Trochanteric bursitis, right hip: Secondary | ICD-10-CM

## 2020-05-21 DIAGNOSIS — M7062 Trochanteric bursitis, left hip: Secondary | ICD-10-CM

## 2020-05-21 DIAGNOSIS — M25511 Pain in right shoulder: Secondary | ICD-10-CM

## 2020-05-21 DIAGNOSIS — M059 Rheumatoid arthritis with rheumatoid factor, unspecified: Secondary | ICD-10-CM | POA: Diagnosis not present

## 2020-05-21 DIAGNOSIS — M19042 Primary osteoarthritis, left hand: Secondary | ICD-10-CM

## 2020-05-21 DIAGNOSIS — D802 Selective deficiency of immunoglobulin A [IgA]: Secondary | ICD-10-CM

## 2020-05-21 DIAGNOSIS — M19041 Primary osteoarthritis, right hand: Secondary | ICD-10-CM

## 2020-05-21 DIAGNOSIS — Z8639 Personal history of other endocrine, nutritional and metabolic disease: Secondary | ICD-10-CM

## 2020-05-21 DIAGNOSIS — M3509 Sicca syndrome with other organ involvement: Secondary | ICD-10-CM | POA: Diagnosis not present

## 2020-05-21 DIAGNOSIS — G8929 Other chronic pain: Secondary | ICD-10-CM

## 2020-05-21 DIAGNOSIS — Z79899 Other long term (current) drug therapy: Secondary | ICD-10-CM

## 2020-05-21 DIAGNOSIS — J4541 Moderate persistent asthma with (acute) exacerbation: Secondary | ICD-10-CM

## 2020-05-21 DIAGNOSIS — Z8719 Personal history of other diseases of the digestive system: Secondary | ICD-10-CM

## 2020-05-21 DIAGNOSIS — Z8669 Personal history of other diseases of the nervous system and sense organs: Secondary | ICD-10-CM

## 2020-05-21 DIAGNOSIS — K219 Gastro-esophageal reflux disease without esophagitis: Secondary | ICD-10-CM

## 2020-05-21 DIAGNOSIS — M797 Fibromyalgia: Secondary | ICD-10-CM

## 2020-05-28 ENCOUNTER — Ambulatory Visit: Payer: 59 | Admitting: Student

## 2020-06-05 ENCOUNTER — Encounter: Payer: Self-pay | Admitting: Family Medicine

## 2020-06-11 ENCOUNTER — Encounter: Payer: Self-pay | Admitting: Orthopedic Surgery

## 2020-06-11 ENCOUNTER — Ambulatory Visit: Payer: 59 | Admitting: Orthopedic Surgery

## 2020-06-11 DIAGNOSIS — S46011D Strain of muscle(s) and tendon(s) of the rotator cuff of right shoulder, subsequent encounter: Secondary | ICD-10-CM | POA: Diagnosis not present
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
## 2020-06-21 ENCOUNTER — Other Ambulatory Visit: Payer: Self-pay | Admitting: Allergy and Immunology

## 2020-06-26 ENCOUNTER — Other Ambulatory Visit: Payer: Self-pay | Admitting: Physician Assistant

## 2020-06-26 DIAGNOSIS — R1013 Epigastric pain: Secondary | ICD-10-CM

## 2020-06-30 ENCOUNTER — Ambulatory Visit: Payer: 59 | Admitting: Allergy and Immunology

## 2020-06-30 ENCOUNTER — Other Ambulatory Visit: Payer: Self-pay

## 2020-06-30 ENCOUNTER — Encounter: Payer: Self-pay | Admitting: Allergy and Immunology

## 2020-06-30 VITALS — BP 122/80 | HR 64 | Temp 97.8°F | Resp 14 | Ht 64.0 in | Wt 177.0 lb

## 2020-06-30 DIAGNOSIS — J4550 Severe persistent asthma, uncomplicated: Secondary | ICD-10-CM

## 2020-06-30 DIAGNOSIS — E8801 Alpha-1-antitrypsin deficiency: Secondary | ICD-10-CM

## 2020-06-30 DIAGNOSIS — Z91038 Other insect allergy status: Secondary | ICD-10-CM

## 2020-06-30 DIAGNOSIS — K219 Gastro-esophageal reflux disease without esophagitis: Secondary | ICD-10-CM

## 2020-06-30 DIAGNOSIS — J3089 Other allergic rhinitis: Secondary | ICD-10-CM

## 2020-06-30 DIAGNOSIS — D802 Selective deficiency of immunoglobulin A [IgA]: Secondary | ICD-10-CM | POA: Diagnosis not present

## 2020-06-30 DIAGNOSIS — L230 Allergic contact dermatitis due to metals: Secondary | ICD-10-CM

## 2020-06-30 NOTE — Patient Instructions (Signed)
  1. Continue to Treat and prevent inflammation:   A. Montelukast 10mg  1 tablet 1 time per day  B. Dupilumab injections every 2 weeks  2. Continue to treat and prevent reflux:    A.  Generic Omeprazole 40 mg + bicarbonate twice a day  3.  If needed:   A. ipratropium 0.06% 2 sprays each nostril every 6 hours   B. Nasacort 1-2 sprays each nostril 1-7 times per week  C. nasal saline wash  D. pro-air respiclick or albuterol nebulization  E. over-the-counter antihistamine / Mucinex DM   F. EpiPen / Auvi-Q 0.3  4. Can restart Symbicort 160 - 2 inhalations 1- 2 times per day during increased asthma activity  5. Arrange for metal patch testing this summer  6. Return to clinic 6 months or earlier if problem
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
## 2020-07-01 ENCOUNTER — Encounter: Payer: Self-pay | Admitting: Allergy and Immunology

## 2020-07-01 MED ORDER — MONTELUKAST SODIUM 10 MG PO TABS: 10.0000 mg | ORAL_TABLET | Freq: Every day | ORAL | 3 refills | Status: DC

## 2020-07-01 MED ORDER — OMEPRAZOLE 40 MG PO CPDR
DELAYED_RELEASE_CAPSULE | ORAL | 0 refills | Status: DC
Start: 2020-07-01 — End: 2020-08-19

## 2020-07-01 MED ORDER — SODIUM BICARBONATE 650 MG PO TABS: ORAL_TABLET | ORAL | 3 refills | Status: DC

## 2020-07-02 ENCOUNTER — Encounter: Payer: Self-pay | Admitting: Family Medicine

## 2020-07-02 MED ORDER — CLONAZEPAM 0.5 MG PO TABS: 0.5000 mg | ORAL_TABLET | Freq: Two times a day (BID) | ORAL | 1 refills | Status: AC | PRN

## 2020-07-07 ENCOUNTER — Encounter: Payer: Self-pay | Admitting: Allergy and Immunology

## 2020-07-13 ENCOUNTER — Other Ambulatory Visit: Payer: Self-pay

## 2020-07-13 ENCOUNTER — Encounter: Payer: Self-pay | Admitting: Family

## 2020-07-13 ENCOUNTER — Ambulatory Visit (INDEPENDENT_AMBULATORY_CARE_PROVIDER_SITE_OTHER): Payer: 59 | Admitting: Family

## 2020-07-13 ENCOUNTER — Ambulatory Visit
Admission: RE | Admit: 2020-07-13 | Discharge: 2020-07-13 | Disposition: A | Discharge status: Home / Self Care | Payer: 59 | Source: Ambulatory Visit | Attending: Physician Assistant | Admitting: Physician Assistant

## 2020-07-13 VITALS — BP 116/72 | HR 83 | Temp 97.4°F | Resp 16 | Ht 64.0 in | Wt 183.0 lb

## 2020-07-13 DIAGNOSIS — R1013 Epigastric pain: Secondary | ICD-10-CM

## 2020-07-13 DIAGNOSIS — L230 Allergic contact dermatitis due to metals: Secondary | ICD-10-CM | POA: Diagnosis not present

## 2020-07-13 NOTE — Progress Notes (Signed)
Follow-up Note  RE: Peggy Wallace MRN: [id number] DOB: Jul 29, 1965 Date of Office Visit: 07/13/2020  Primary care provider: Lavada Mesi, MD Referring provider: Lavada Mesi, MD   Peggy Wallace returns to the office today for the patch test placement, given suspected history of contact dermatitis.    Diagnostics: Metal patches placed.    Plan:   Allergic contact dermatitis - Instructions provided on care of the patches for the next 48 hours. - Peggy Wallace was instructed to avoid showering for the next 48 hours. - Peggy Wallace will follow up in 48 hours and 96 hours for patch readings.

## 2020-07-13 NOTE — Addendum Note (Signed)
Addended by: Orson Aloe on: 07/13/2020 04:05 PM   Modules accepted: Orders

## 2020-07-15 ENCOUNTER — Ambulatory Visit: Payer: 59 | Admitting: Allergy

## 2020-07-15 ENCOUNTER — Encounter: Payer: Self-pay | Admitting: Allergy

## 2020-07-15 ENCOUNTER — Other Ambulatory Visit: Payer: Self-pay

## 2020-07-15 DIAGNOSIS — L230 Allergic contact dermatitis due to metals: Secondary | ICD-10-CM

## 2020-07-15 NOTE — Progress Notes (Signed)
    Follow-up Note  RE: Peggy Wallace MRN: [id number] DOB: 23-Feb-1966 Date of Office Visit: 07/15/2020  Primary care provider: Lavada Mesi, MD Referring provider: Lavada Mesi, MD   [name] returns to the office today for the initial patch test interpretation, given suspected history of contact dermatitis.    Diagnostics:  Metal TEST 48 hour reading:  nickel sulfate hexahydrate 5% (erythema),   copper sulfate penta hydrate 2% (erythema), 1+ manganese chloride 0.5% (papule)  Plan:  Allergic contact dermatitis She will return in 2 days for final reading  We will send results to her oral surgeon Dr. Monia Pouch at [facility]  [name], MD [facility]

## 2020-07-17 ENCOUNTER — Ambulatory Visit: Payer: 59 | Admitting: Allergy

## 2020-07-17 ENCOUNTER — Encounter: Payer: Self-pay | Admitting: Allergy

## 2020-07-17 ENCOUNTER — Other Ambulatory Visit: Payer: Self-pay

## 2020-07-17 VITALS — Temp 97.6°F

## 2020-07-17 DIAGNOSIS — L230 Allergic contact dermatitis due to metals: Secondary | ICD-10-CM

## 2020-07-17 NOTE — Progress Notes (Signed)
    Follow-up Note  RE: Peggy Wallace MRN: [id number] DOB: June 02, 1965 Date of Office Visit: 07/17/2020  Primary care provider: Lavada Mesi, MD Referring provider: Lavada Mesi, MD   Peggy Wallace returns to the office today for the final patch test interpretation, given suspected history of contact dermatitis.   She states she did receive the informational sheets for the dental implant she will have placed next week.  She states the implant is made of titanium.    Diagnostics:  Metal TEST 96 hour reading: negative  Metal TEST 48 hour reading:  nickel sulfate hexahydrate 5% (erythema),   copper sulfate penta hydrate 2% (erythema), 1+ manganese chloride 0.5% (papule)  Plan:  Allergic contact dermatitis to metal - advised avoidance as much as possible to products containing nickel, copper and manganese as above.   [name], MD [facility]

## 2020-07-21 ENCOUNTER — Other Ambulatory Visit: Payer: Self-pay | Admitting: Allergy and Immunology

## 2020-07-30 NOTE — Addendum Note (Signed)
Addended by: Robet Leu A on: 07/30/2020 05:45 PM   Modules accepted: Orders

## 2020-08-09 ENCOUNTER — Other Ambulatory Visit: Payer: Self-pay | Admitting: Allergy and Immunology

## 2020-08-18 ENCOUNTER — Encounter: Payer: Self-pay | Admitting: Allergy and Immunology

## 2020-08-18 ENCOUNTER — Other Ambulatory Visit: Payer: Self-pay | Admitting: Allergy and Immunology

## 2020-08-27 ENCOUNTER — Other Ambulatory Visit: Payer: Self-pay | Admitting: Rheumatology

## 2020-08-28 NOTE — Telephone Encounter (Signed)
Last Visit: 05/21/2020 Next Visit: 10/22/2020 Labs: 05/14/2020, CBC and CMP are normal. Eye exam: 03/09/2020  Current Dose per office note 05/21/2020, Plaquenil 200 mg 1 tablet by mouth daily  DX: Sjogren's syndrome with other organ involvement   Last Fill:   Okay to refill Plaquenil?

## 2020-09-04 ENCOUNTER — Encounter: Payer: Self-pay | Admitting: Allergy and Immunology

## 2020-09-18 ENCOUNTER — Encounter: Payer: Self-pay | Admitting: Family Medicine

## 2020-09-29 ENCOUNTER — Telehealth: Payer: 59

## 2020-09-29 NOTE — Telephone Encounter (Signed)
Called and left a message for patient to inform her that her Auvi-Q prescriptions have been sent over via fax and a copy has been sent to her via mail. Patient was informed to call our office with any questions or concerns.
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
## 2020-10-19 ENCOUNTER — Other Ambulatory Visit: Payer: Self-pay | Admitting: *Deleted

## 2020-10-19 DIAGNOSIS — Z79899 Other long term (current) drug therapy: Secondary | ICD-10-CM

## 2020-10-20 LAB — COMPLETE METABOLIC PANEL WITH GFR
AG Ratio: 1.6 (calc) (ref 1.0–2.5)
ALT: 13 U/L (ref 6–29)
AST: 19 U/L (ref 10–35)
Albumin: 4.4 g/dL (ref 3.6–5.1)
Alkaline phosphatase (APISO): 48 U/L (ref 37–153)
BUN: 21 mg/dL (ref 7–25)
CO2: 29 mmol/L (ref 20–32)
Calcium: 9.1 mg/dL (ref 8.6–10.4)
Chloride: 105 mmol/L (ref 98–110)
Creat: 0.93 mg/dL (ref 0.50–1.03)
Globulin: 2.8 g/dL (calc) (ref 1.9–3.7)
Glucose, Bld: 77 mg/dL (ref 65–99)
Potassium: 4.7 mmol/L (ref 3.5–5.3)
Sodium: 141 mmol/L (ref 135–146)
Total Bilirubin: 0.3 mg/dL (ref 0.2–1.2)
Total Protein: 7.2 g/dL (ref 6.1–8.1)
eGFR: 73 mL/min/{1.73_m2} (ref >=60)

## 2020-10-20 LAB — CBC WITH DIFFERENTIAL/PLATELET
Absolute Monocytes: 435 cells/uL (ref 200–950)
Basophils Absolute: 12 cells/uL (ref 0–200)
Basophils Relative: 0.2 %
Eosinophils Absolute: 99 cells/uL (ref 15–500)
Eosinophils Relative: 1.7 %
HCT: 40.7 % (ref 35.0–45.0)
Hemoglobin: 13.6 g/dL (ref 11.7–15.5)
Lymphs Abs: 2013 cells/uL (ref 850–3900)
MCH: 29.6 pg (ref 27.0–33.0)
MCHC: 33.4 g/dL (ref 32.0–36.0)
MCV: 88.5 fL (ref 80.0–100.0)
MPV: 11.1 fL (ref 7.5–12.5)
Monocytes Relative: 7.5 %
Neutro Abs: 3242 cells/uL (ref 1500–7800)
Neutrophils Relative %: 55.9 %
Platelets: 248 10*3/uL (ref 140–400)
RBC: 4.6 10*6/uL (ref 3.80–5.10)
RDW: 12.2 % (ref 11.0–15.0)
Total Lymphocyte: 34.7 %
WBC: 5.8 10*3/uL (ref 3.8–10.8)

## 2020-10-21 NOTE — Progress Notes (Signed)
CBC and CMP are normal.

## 2020-10-22 ENCOUNTER — Encounter: Payer: Self-pay | Admitting: Rheumatology

## 2020-10-22 ENCOUNTER — Other Ambulatory Visit: Payer: Self-pay

## 2020-10-22 ENCOUNTER — Ambulatory Visit: Payer: 59 | Admitting: Rheumatology

## 2020-10-22 VITALS — BP 108/76 | HR 82 | Resp 15 | Ht 63.0 in | Wt 188.4 lb

## 2020-10-22 DIAGNOSIS — I471 Supraventricular tachycardia: Secondary | ICD-10-CM

## 2020-10-22 DIAGNOSIS — M25511 Pain in right shoulder: Secondary | ICD-10-CM

## 2020-10-22 DIAGNOSIS — M19042 Primary osteoarthritis, left hand: Secondary | ICD-10-CM

## 2020-10-22 DIAGNOSIS — M7061 Trochanteric bursitis, right hip: Secondary | ICD-10-CM

## 2020-10-22 DIAGNOSIS — Z79899 Other long term (current) drug therapy: Secondary | ICD-10-CM | POA: Diagnosis not present

## 2020-10-22 DIAGNOSIS — M3500 Sicca syndrome, unspecified: Secondary | ICD-10-CM | POA: Diagnosis not present

## 2020-10-22 DIAGNOSIS — M797 Fibromyalgia: Secondary | ICD-10-CM

## 2020-10-22 DIAGNOSIS — Z8719 Personal history of other diseases of the digestive system: Secondary | ICD-10-CM

## 2020-10-22 DIAGNOSIS — D802 Selective deficiency of immunoglobulin A [IgA]: Secondary | ICD-10-CM

## 2020-10-22 DIAGNOSIS — M059 Rheumatoid arthritis with rheumatoid factor, unspecified: Secondary | ICD-10-CM | POA: Diagnosis not present

## 2020-10-22 DIAGNOSIS — J4541 Moderate persistent asthma with (acute) exacerbation: Secondary | ICD-10-CM

## 2020-10-22 DIAGNOSIS — Z8669 Personal history of other diseases of the nervous system and sense organs: Secondary | ICD-10-CM

## 2020-10-22 DIAGNOSIS — M7062 Trochanteric bursitis, left hip: Secondary | ICD-10-CM

## 2020-10-22 DIAGNOSIS — Z8639 Personal history of other endocrine, nutritional and metabolic disease: Secondary | ICD-10-CM

## 2020-10-22 DIAGNOSIS — G8929 Other chronic pain: Secondary | ICD-10-CM

## 2020-10-22 DIAGNOSIS — K219 Gastro-esophageal reflux disease without esophagitis: Secondary | ICD-10-CM

## 2020-10-22 DIAGNOSIS — M19041 Primary osteoarthritis, right hand: Secondary | ICD-10-CM

## 2020-10-22 NOTE — Patient Instructions (Signed)
Hand Exercises Hand exercises can be helpful for almost anyone. These exercises can strengthen the hands, improve flexibility and movement, and increase blood flow to the hands. These results can make work and daily tasks easier. Hand exercises can be especially helpful for people who have joint pain from arthritis or have nerve damage from overuse (carpal tunnel syndrome). These exercises can also help people who have injured a hand. Exercises Most of these hand exercises are gentle stretching and motion exercises. It is usually safe to do them often throughout the day. Warming up your hands before exercise may help to reduce stiffness. You can do this with gentle massage orby placing your hands in warm water for 10-15 minutes. It is normal to feel some stretching, pulling, tightness, or mild discomfort as you begin new exercises. This will gradually improve. Stop an exercise right away if you feel sudden, severe pain or your pain gets worse. Ask your healthcare provider which exercises are best for you. Knuckle bend or "claw" fist Stand or sit with your arm, hand, and all five fingers pointed straight up. Make sure to keep your wrist straight during the exercise. Gently bend your fingers down toward your palm until the tips of your fingers are touching the top of your palm. Keep your big knuckle straight and just bend the small knuckles in your fingers. Hold this position for __________ seconds. Straighten (extend) your fingers back to the starting position. Repeat this exercise 5-10 times with each hand. Full finger fist Stand or sit with your arm, hand, and all five fingers pointed straight up. Make sure to keep your wrist straight during the exercise. Gently bend your fingers into your palm until the tips of your fingers are touching the middle of your palm. Hold this position for __________ seconds. Extend your fingers back to the starting position, stretching every joint fully. Repeat this  exercise 5-10 times with each hand. Straight fist Stand or sit with your arm, hand, and all five fingers pointed straight up. Make sure to keep your wrist straight during the exercise. Gently bend your fingers at the big knuckle, where your fingers meet your hand, and the middle knuckle. Keep the knuckle at the tips of your fingers straight and try to touch the bottom of your palm. Hold this position for __________ seconds. Extend your fingers back to the starting position, stretching every joint fully. Repeat this exercise 5-10 times with each hand. Tabletop Stand or sit with your arm, hand, and all five fingers pointed straight up. Make sure to keep your wrist straight during the exercise. Gently bend your fingers at the big knuckle, where your fingers meet your hand, as far down as you can while keeping the small knuckles in your fingers straight. Think of forming a tabletop with your fingers. Hold this position for __________ seconds. Extend your fingers back to the starting position, stretching every joint fully. Repeat this exercise 5-10 times with each hand. Finger spread Place your hand flat on a table with your palm facing down. Make sure your wrist stays straight as you do this exercise. Spread your fingers and thumb apart from each other as far as you can until you feel a gentle stretch. Hold this position for __________ seconds. Bring your fingers and thumb tight together again. Hold this position for __________ seconds. Repeat this exercise 5-10 times with each hand. Making circles Stand or sit with your arm, hand, and all five fingers pointed straight up. Make sure to keep your   wrist straight during the exercise. Make a circle by touching the tip of your thumb to the tip of your index finger. Hold for __________ seconds. Then open your hand wide. Repeat this motion with your thumb and each finger on your hand. Repeat this exercise 5-10 times with each hand. Thumb motion Sit  with your forearm resting on a table and your wrist straight. Your thumb should be facing up toward the ceiling. Keep your fingers relaxed as you move your thumb. Lift your thumb up as high as you can toward the ceiling. Hold for __________ seconds. Bend your thumb across your palm as far as you can, reaching the tip of your thumb for the small finger (pinkie) side of your palm. Hold for __________ seconds. Repeat this exercise 5-10 times with each hand. Grip strengthening  Hold a stress ball or other soft ball in the middle of your hand. Slowly increase the pressure, squeezing the ball as much as you can without causing pain. Think of bringing the tips of your fingers into the middle of your palm. All of your finger joints should bend when doing this exercise. Hold your squeeze for __________ seconds, then relax. Repeat this exercise 5-10 times with each hand. Contact a health care provider if: Your hand pain or discomfort gets much worse when you do an exercise. Your hand pain or discomfort does not improve within 2 hours after you exercise. If you have any of these problems, stop doing these exercises right away. Do not do them again unless your health care provider says that you can. Get help right away if: You develop sudden, severe hand pain or swelling. If this happens, stop doing these exercises right away. Do not do them again unless your health care provider says that you can. This information is not intended to replace advice given to you by your health care provider. Make sure you discuss any questions you have with your healthcare provider. Document Revised: 07/19/2018 Document Reviewed: 03/29/2018 Elsevier Patient Education  [place] [facility] Ask your health care provider which exercises are safe for you. Do exercises exactly as told by your health care provider and adjust them as directed. It is normal to feel mild stretching, pulling, tightness, or  discomfort as you do these exercises. Stop right away if you feel sudden pain or your pain gets significantly worse. Do not begin these exercises until told by your health care provider. Stretching and range-of-motion exercises These exercises warm up your muscles and joints and improve the movement andflexibility of your hip and pelvis. Quadriceps stretch, prone  Lie on your abdomen (prone position) on a firm surface, such as a bed or padded floor. Bend your left / right knee and reach back to hold your ankle or pant leg. If you cannot reach your ankle or pant leg, loop a belt around your foot and grab the belt instead. Gently pull your heel toward your buttocks. Your knee should not slide out to the side. You should feel a stretch in the front of your thigh and knee (quadriceps). Hold this position for __________ seconds. Repeat __________ times. Complete this exercise __________ times a day. Iliotibial band stretch An iliotibial band is a strong band of muscle tissue that runs from the outer side of your hip to the outer side of your thigh and knee. Lie on your side with your left / right leg in the top position. Bend both of your knees and grab your left /  right ankle. Stretch out your bottom arm to help you balance. Slowly bring your top knee back so your thigh goes behind your trunk. Slowly lower your top leg toward the floor until you feel a gentle stretch on the outside of your left / right hip and thigh. If you do not feel a stretch and your knee will not fall farther, place the heel of your other foot on top of your knee and pull your knee down toward the floor with your foot. Hold this position for __________ seconds. Repeat __________ times. Complete this exercise __________ times a day. Strengthening exercises These exercises build strength and endurance in your hip and pelvis. Enduranceis the ability to use your muscles for a long time, even after they get tired. Straight leg  raises, side-lying This exercise strengthens the muscles that rotate the leg at the hip and move it away from your body (hip abductors). Lie on your side with your left / right leg in the top position. Lie so your head, shoulder, hip, and knee line up. You may bend your bottom knee to help you balance. Roll your hips slightly forward so your hips are stacked directly over each other and your left / right knee is facing forward. Tense the muscles in your outer thigh and lift your top leg 4-6 inches (10-15 cm). Hold this position for __________ seconds. Slowly lower your leg to return to the starting position. Let your muscles relax completely before doing another repetition. Repeat __________ times. Complete this exercise __________ times a day. Leg raises, prone This exercise strengthens the muscles that move the hips backward (hip extensors). Lie on your abdomen (prone position) on your bed or a firm surface. You can put a pillow under your hips if that is more comfortable for your lower back. Bend your left / right knee so your foot is straight up in the air. Squeeze your buttocks muscles and lift your left / right thigh off the bed. Do not let your back arch. Tense your thigh muscle as hard as you can without increasing any knee pain. Hold this position for __________ seconds. Slowly lower your leg to return to the starting position and allow it to relax completely. Repeat __________ times. Complete this exercise __________ times a day. Hip hike Stand sideways on a bottom step. Stand on your left / right leg with your other foot unsupported next to the step. You can hold on to a railing or wall for balance if needed. Keep your knees straight and your torso square. Then lift your left / right hip up toward the ceiling. Slowly let your left / right hip lower toward the floor, past the starting position. Your foot should get closer to the floor. Do not lean or bend your knees. Repeat __________  times. Complete this exercise __________ times a day. This information is not intended to replace advice given to you by your health care provider. Make sure you discuss any questions you have with your healthcare provider. Document Revised: 06/05/2019 Document Reviewed: 06/05/2019 Elsevier Patient Education  2022 Elsevier Inc[employer]  Heart Disease Prevention   Your inflammatory disease increases your risk of heart disease which includes heart attack, stroke, atrial fibrillation (irregular heartbeats), high blood pressure, heart failure and atherosclerosis (plaque in the arteries).  It is important to reduce your risk by:   Keep blood pressure, cholesterol, and blood sugar at healthy levels   Smoking Cessation   Maintain a healthy weight  BMI 20-25  Eat a healthy diet  Plenty of fresh fruit, vegetables, and whole grains  Limit saturated fats, foods high in sodium, and added sugars  DASH and Mediterranean diet   Increase physical activity  Recommend moderate physically activity for 150 minutes per week/ 30 minutes a day for five days a week These can be broken up into three separate ten-minute sessions during the day.   Reduce Stress  Meditation, slow breathing exercises, yoga, coloring books  Dental visits twice a year

## 2020-11-10 ENCOUNTER — Other Ambulatory Visit: Payer: Self-pay

## 2020-11-10 NOTE — Telephone Encounter (Signed)
Patient called stating she woke up today with right thumb pain.  Patient states there is a little swelling and stiffness.  Patient requested to speak with the nurse.

## 2020-11-11 ENCOUNTER — Encounter: Payer: Self-pay | Admitting: Rheumatology

## 2020-11-11 MED ORDER — PREDNISONE 5 MG PO TABS: ORAL_TABLET | ORAL | 0 refills | Status: DC

## 2020-11-11 MED ORDER — HYDROXYCHLOROQUINE SULFATE 200 MG PO TABS: ORAL_TABLET | ORAL | 0 refills | Status: DC

## 2020-11-11 NOTE — Telephone Encounter (Signed)
Attempted to contact the patient and left message for patient to call the office.  

## 2020-11-11 NOTE — Telephone Encounter (Signed)
I returned patient's call.  I had detailed discussion with Peggy Wallace.  She states she was packing some  boxes last night and in the middle of the night she woke up with increased pain and discomfort in her right thumb.  She is having a lot of discomfort.  She most likely have a rheumatoid arthritis flare.  She is on low-dose hydroxychloroquine.  She states she could not take PLQ twice a day because she has to take her antacids and also taking Plaquenil at nighttime bothers her stomach.  I will call in a prednisone taper starting at 20 mg and taper by 5 mg every 2 days.  Side effects of prednisone were discussed.  I also discussed increasing hydroxychloroquine to 200 mg p.o. 2 tablets daily from Monday to Friday.  She was in agreement.  Please send a prescription for prednisone and hydroxychloroquine.  I do not think she had a gout flare.  Although we we will check uric acid with her next labs from

## 2020-11-11 NOTE — Telephone Encounter (Signed)
Patient states she is having Right thumb joint closest to her thumb nail painful. Patient states is started on 11/10/2020.  Progressively got worse. Patient states it woke her out of her sleep this morning. Patient states it is a throbbing pain. Patient states it is red, swollen and warm to the touch. Patient states it is mostly swollen on the backside of her thumb. Patient describes it as being "tight". Patient states her father has a history of gout. Patient states she has not had gout in the past but wonders if this may be what it is. Patient states she is unable to use her thumb and she is right hand dominate. Patient is on PLQ and taking as prescribed. Patient's pharmacy is [facility]. Please advise.

## 2020-11-18 DIAGNOSIS — F411 Generalized anxiety disorder: Secondary | ICD-10-CM | POA: Diagnosis not present

## 2020-11-20 ENCOUNTER — Other Ambulatory Visit: Payer: Self-pay | Admitting: *Deleted

## 2020-11-20 DIAGNOSIS — J3089 Other allergic rhinitis: Secondary | ICD-10-CM

## 2020-11-20 DIAGNOSIS — J4541 Moderate persistent asthma with (acute) exacerbation: Secondary | ICD-10-CM

## 2020-11-20 MED ORDER — MONTELUKAST SODIUM 10 MG PO TABS: 10.0000 mg | ORAL_TABLET | Freq: Every day | ORAL | 3 refills | Status: DC

## 2020-11-24 ENCOUNTER — Other Ambulatory Visit: Payer: Self-pay

## 2020-11-24 DIAGNOSIS — J3089 Other allergic rhinitis: Secondary | ICD-10-CM

## 2020-11-24 DIAGNOSIS — J4541 Moderate persistent asthma with (acute) exacerbation: Secondary | ICD-10-CM

## 2020-11-24 MED ORDER — MONTELUKAST SODIUM 10 MG PO TABS: 10.0000 mg | ORAL_TABLET | Freq: Every day | ORAL | 0 refills | Status: DC

## 2020-11-26 ENCOUNTER — Encounter: Payer: Self-pay | Admitting: Family Medicine

## 2020-11-26 ENCOUNTER — Ambulatory Visit: Payer: BC Managed Care – PPO | Admitting: Family Medicine

## 2020-11-26 ENCOUNTER — Other Ambulatory Visit: Payer: Self-pay

## 2020-11-26 VITALS — BP 103/66 | HR 76 | Ht 64.0 in | Wt 182.2 lb

## 2020-11-26 DIAGNOSIS — R7681 Abnormal rheumatoid factor and anti-citrullinated protein antibody without rheumatoid arthritis: Secondary | ICD-10-CM

## 2020-11-26 DIAGNOSIS — K582 Mixed irritable bowel syndrome: Secondary | ICD-10-CM | POA: Diagnosis not present

## 2020-11-26 DIAGNOSIS — R768 Other specified abnormal immunological findings in serum: Secondary | ICD-10-CM

## 2020-11-26 DIAGNOSIS — Z0000 Encounter for general adult medical examination without abnormal findings: Secondary | ICD-10-CM | POA: Diagnosis not present

## 2020-11-26 DIAGNOSIS — J4541 Moderate persistent asthma with (acute) exacerbation: Secondary | ICD-10-CM

## 2020-11-26 DIAGNOSIS — E785 Hyperlipidemia, unspecified: Secondary | ICD-10-CM

## 2020-11-26 DIAGNOSIS — F418 Other specified anxiety disorders: Secondary | ICD-10-CM

## 2020-11-26 DIAGNOSIS — R7989 Other specified abnormal findings of blood chemistry: Secondary | ICD-10-CM

## 2020-11-26 DIAGNOSIS — M0600 Rheumatoid arthritis without rheumatoid factor, unspecified site: Secondary | ICD-10-CM | POA: Insufficient documentation

## 2020-11-26 DIAGNOSIS — M3500 Sicca syndrome, unspecified: Secondary | ICD-10-CM

## 2020-11-26 DIAGNOSIS — D802 Selective deficiency of immunoglobulin A [IgA]: Secondary | ICD-10-CM

## 2020-11-26 MED ORDER — RIFAXIMIN 200 MG PO TABS: 400.0000 mg | ORAL_TABLET | Freq: Three times a day (TID) | ORAL | 6 refills | Status: DC

## 2020-11-26 MED ORDER — AZITHROMYCIN 250 MG PO TABS
ORAL_TABLET | ORAL | 0 refills | Status: DC
Start: 2020-11-26 — End: 2021-07-06
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
## 2020-12-07 ENCOUNTER — Encounter: Payer: Self-pay | Admitting: Allergy and Immunology

## 2020-12-07 NOTE — Telephone Encounter (Signed)
Pt called stating she is on vacation in the [address] and has tested positive for covid. Patient is wondering if she should have the Urgent Care doctor call Dr. Lucie Leather directly since Dr. Lucie Leather knows patient's history better. Patient is requesting Dr. Lucie Leather be available if possible in case the urgent care reaches out to him.   Patient did provide the urgent care [phone]

## 2020-12-15 ENCOUNTER — Telehealth: Payer: Self-pay

## 2020-12-15 MED ORDER — PREDNISONE 10 MG PO TABS: 10.0000 mg | ORAL_TABLET | Freq: Every day | ORAL | 0 refills | Status: AC

## 2020-12-15 MED ORDER — ALBUTEROL SULFATE (2.5 MG/3ML) 0.083% IN NEBU: INHALATION_SOLUTION | RESPIRATORY_TRACT | 0 refills | Status: AC

## 2020-12-15 MED ORDER — BUDESONIDE-FORMOTEROL FUMARATE 160-4.5 MCG/ACT IN AERO: INHALATION_SPRAY | RESPIRATORY_TRACT | 0 refills | Status: DC

## 2020-12-15 NOTE — Telephone Encounter (Signed)
Patient called and expressed that she is wanting to know if Dr. Lucie Leather will prescribe prednisone for her as she recently had a battle with Covid and could not take the prednisone as she was taking paxlovid and couldn't take both at the time she was battling Covid. Please advise if she should take prednisone now as she is still coughing. Patient is also taking her Symbicort and using her nebulizer to help with her cough and small asthma flares post Covid. Please advise, thank you!

## 2020-12-15 NOTE — Telephone Encounter (Signed)
Called and spoke to patient and informed her of the note per Dr. Lucie Leather. Prednisone as well as Symbicort and nebulizer solution have been sent in to [facility] per patients request.

## 2020-12-15 NOTE — Addendum Note (Signed)
Addended by: Robet Leu A on: 12/15/2020 10:31 AM   Modules accepted: Orders

## 2020-12-15 NOTE — Telephone Encounter (Signed)
Please inform Peggy Wallace that we can use prednisone 10 mg 1 tablet 1 time per day for 10 days in the treatment of her post COVID cough.  Certainly if this does not help or she develops progressive respiratory symptoms she will need to contact us for further evaluation and treatment.

## 2020-12-16 ENCOUNTER — Encounter: Payer: Self-pay | Admitting: Allergy and Immunology

## 2020-12-16 ENCOUNTER — Other Ambulatory Visit: Payer: Self-pay

## 2020-12-16 DIAGNOSIS — J4541 Moderate persistent asthma with (acute) exacerbation: Secondary | ICD-10-CM

## 2020-12-16 DIAGNOSIS — J3089 Other allergic rhinitis: Secondary | ICD-10-CM

## 2020-12-16 MED ORDER — MONTELUKAST SODIUM 10 MG PO TABS: 10.0000 mg | ORAL_TABLET | Freq: Every day | ORAL | 0 refills | Status: DC

## 2020-12-22 ENCOUNTER — Other Ambulatory Visit: Payer: Self-pay

## 2020-12-22 MED ORDER — BENZONATATE 100 MG PO CAPS: ORAL_CAPSULE | ORAL | 1 refills | Status: DC

## 2020-12-28 NOTE — Telephone Encounter (Signed)
Dr .Kozlow, please advise.  

## 2020-12-28 NOTE — Telephone Encounter (Signed)
Please see if Peggy Wallace can come in and see me tomorrow in [place].

## 2020-12-28 NOTE — Telephone Encounter (Signed)
Patient believes she has a sinus infection. Patient started "blowing green" last night and has had sinus pressure and pain. Patient has taken sudafed this morning and night time cold and flu medicine. Patient has used her neti pot last night and this morning as well. Patient states she has a cough from the covid and raspatory infection, cough is getting better.   Best contact number for patient [phone]

## 2020-12-29 ENCOUNTER — Encounter: Payer: Self-pay | Admitting: Allergy and Immunology

## 2020-12-29 ENCOUNTER — Ambulatory Visit (INDEPENDENT_AMBULATORY_CARE_PROVIDER_SITE_OTHER): Payer: BC Managed Care – PPO | Admitting: Allergy and Immunology

## 2020-12-29 ENCOUNTER — Other Ambulatory Visit: Payer: Self-pay

## 2020-12-29 VITALS — BP 104/70 | HR 78 | Temp 97.8°F | Resp 19 | Ht 64.0 in | Wt 181.4 lb

## 2020-12-29 DIAGNOSIS — J0140 Acute pansinusitis, unspecified: Secondary | ICD-10-CM

## 2020-12-29 DIAGNOSIS — J3089 Other allergic rhinitis: Secondary | ICD-10-CM | POA: Diagnosis not present

## 2020-12-29 DIAGNOSIS — D802 Selective deficiency of immunoglobulin A [IgA]: Secondary | ICD-10-CM

## 2020-12-29 DIAGNOSIS — E8801 Alpha-1-antitrypsin deficiency: Secondary | ICD-10-CM | POA: Diagnosis not present

## 2020-12-29 DIAGNOSIS — J4541 Moderate persistent asthma with (acute) exacerbation: Secondary | ICD-10-CM | POA: Diagnosis not present

## 2020-12-29 DIAGNOSIS — U099 Post covid-19 condition, unspecified: Secondary | ICD-10-CM

## 2020-12-29 DIAGNOSIS — J45998 Other asthma: Secondary | ICD-10-CM | POA: Diagnosis not present

## 2020-12-29 MED ORDER — AMOXICILLIN-POT CLAVULANATE 875-125 MG PO TABS: 1.0000 | ORAL_TABLET | Freq: Two times a day (BID) | ORAL | 0 refills | Status: AC

## 2020-12-29 MED ORDER — MONTELUKAST SODIUM 10 MG PO TABS: 10.0000 mg | ORAL_TABLET | Freq: Every day | ORAL | 5 refills | Status: DC

## 2020-12-29 MED ORDER — BREZTRI AEROSPHERE 160-9-4.8 MCG/ACT IN AERO: 2.0000 | INHALATION_SPRAY | Freq: Two times a day (BID) | RESPIRATORY_TRACT | 5 refills | Status: DC

## 2020-12-29 NOTE — Telephone Encounter (Addendum)
Called patient and left a message letting her know Dr. Lucie Leather wanted to see her in the [place] office today if possible. I told her to call the office back and let us know.  [phone]
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
## 2020-12-29 NOTE — Telephone Encounter (Signed)
Patient call back and was schedule for today 12/29/2020 to see Dr. Lucie Leather.

## 2020-12-29 NOTE — Patient Instructions (Signed)
  1. Continue to Treat and prevent inflammation:   A. Montelukast 10mg  1 tablet 1 time per day  B. Dupilumab injections every 2 weeks  C. BREZTRI - 2 inhalations 2 times per day (replaces Symbicort)  2. Continue to treat and prevent reflux:    A.  Generic Omeprazole 40 mg + bicarbonate twice a day  3.  If needed:   A. ipratropium 0.06% 2 sprays each nostril every 6 hours   B. Nasacort 1-2 sprays each nostril 1-7 times per week  C. nasal saline wash  D. pro-air respiclick or albuterol nebulization  E. over-the-counter antihistamine / Mucinex DM   F. EpiPen / Auvi-Q 0.3  4. Treat infection with Augmentin 875 - 1 tablet 2 times per day for 14 days  5. Return to clinic 6 months or earlier if problem

## 2020-12-30 ENCOUNTER — Encounter: Payer: Self-pay | Admitting: Allergy and Immunology

## 2020-12-31 NOTE — Addendum Note (Signed)
Addended by: Robet Leu A on: 12/31/2020 04:51 PM   Modules accepted: Orders

## 2021-01-05 ENCOUNTER — Ambulatory Visit: Payer: 59 | Admitting: Allergy and Immunology

## 2021-01-06 ENCOUNTER — Other Ambulatory Visit: Payer: Self-pay

## 2021-01-06 DIAGNOSIS — J4541 Moderate persistent asthma with (acute) exacerbation: Secondary | ICD-10-CM

## 2021-01-06 MED ORDER — MONTELUKAST SODIUM 10 MG PO TABS: 10.0000 mg | ORAL_TABLET | Freq: Every day | ORAL | 1 refills | Status: DC

## 2021-01-08 ENCOUNTER — Other Ambulatory Visit: Payer: Self-pay

## 2021-01-08 DIAGNOSIS — J4541 Moderate persistent asthma with (acute) exacerbation: Secondary | ICD-10-CM

## 2021-01-08 MED ORDER — MONTELUKAST SODIUM 10 MG PO TABS: 10.0000 mg | ORAL_TABLET | Freq: Every day | ORAL | 1 refills | Status: DC

## 2021-01-18 DIAGNOSIS — M5451 Vertebrogenic low back pain: Secondary | ICD-10-CM | POA: Diagnosis not present

## 2021-01-26 DIAGNOSIS — M5451 Vertebrogenic low back pain: Secondary | ICD-10-CM | POA: Diagnosis not present

## 2021-01-29 ENCOUNTER — Other Ambulatory Visit: Payer: Self-pay | Admitting: *Deleted

## 2021-01-29 DIAGNOSIS — J4541 Moderate persistent asthma with (acute) exacerbation: Secondary | ICD-10-CM

## 2021-01-29 DIAGNOSIS — M5451 Vertebrogenic low back pain: Secondary | ICD-10-CM | POA: Diagnosis not present

## 2021-01-29 MED ORDER — MONTELUKAST SODIUM 10 MG PO TABS: 10.0000 mg | ORAL_TABLET | Freq: Every day | ORAL | 1 refills | Status: DC

## 2021-02-01 DIAGNOSIS — M5451 Vertebrogenic low back pain: Secondary | ICD-10-CM | POA: Diagnosis not present

## 2021-02-02 ENCOUNTER — Encounter: Payer: Self-pay | Admitting: Allergy and Immunology

## 2021-02-02 DIAGNOSIS — H02845 Edema of left lower eyelid: Secondary | ICD-10-CM | POA: Diagnosis not present

## 2021-02-03 ENCOUNTER — Other Ambulatory Visit: Payer: Self-pay | Admitting: *Deleted

## 2021-02-04 DIAGNOSIS — M5451 Vertebrogenic low back pain: Secondary | ICD-10-CM | POA: Diagnosis not present

## 2021-02-08 DIAGNOSIS — M5451 Vertebrogenic low back pain: Secondary | ICD-10-CM | POA: Diagnosis not present

## 2021-02-11 DIAGNOSIS — M5451 Vertebrogenic low back pain: Secondary | ICD-10-CM | POA: Diagnosis not present

## 2021-02-11 DIAGNOSIS — M722 Plantar fascial fibromatosis: Secondary | ICD-10-CM | POA: Diagnosis not present

## 2021-02-15 DIAGNOSIS — M5451 Vertebrogenic low back pain: Secondary | ICD-10-CM | POA: Diagnosis not present

## 2021-02-18 ENCOUNTER — Other Ambulatory Visit: Payer: Self-pay | Admitting: *Deleted

## 2021-02-18 DIAGNOSIS — M5451 Vertebrogenic low back pain: Secondary | ICD-10-CM | POA: Diagnosis not present

## 2021-02-22 DIAGNOSIS — M5451 Vertebrogenic low back pain: Secondary | ICD-10-CM | POA: Diagnosis not present

## 2021-02-23 ENCOUNTER — Other Ambulatory Visit: Payer: Self-pay | Admitting: *Deleted

## 2021-02-23 DIAGNOSIS — G43839 Menstrual migraine, intractable, without status migrainosus: Secondary | ICD-10-CM | POA: Diagnosis not present

## 2021-02-23 DIAGNOSIS — G43019 Migraine without aura, intractable, without status migrainosus: Secondary | ICD-10-CM | POA: Diagnosis not present

## 2021-02-25 DIAGNOSIS — M5451 Vertebrogenic low back pain: Secondary | ICD-10-CM | POA: Diagnosis not present

## 2021-03-01 DIAGNOSIS — M5451 Vertebrogenic low back pain: Secondary | ICD-10-CM | POA: Diagnosis not present

## 2021-03-10 DIAGNOSIS — M5451 Vertebrogenic low back pain: Secondary | ICD-10-CM | POA: Diagnosis not present

## 2021-03-16 DIAGNOSIS — M5451 Vertebrogenic low back pain: Secondary | ICD-10-CM | POA: Diagnosis not present

## 2021-03-18 ENCOUNTER — Other Ambulatory Visit: Payer: Self-pay | Admitting: *Deleted

## 2021-03-18 DIAGNOSIS — J4541 Moderate persistent asthma with (acute) exacerbation: Secondary | ICD-10-CM

## 2021-03-18 MED ORDER — MONTELUKAST SODIUM 10 MG PO TABS: 10.0000 mg | ORAL_TABLET | Freq: Every day | ORAL | 1 refills | Status: DC

## 2021-03-23 ENCOUNTER — Encounter: Payer: Self-pay | Admitting: Allergy and Immunology

## 2021-03-25 DIAGNOSIS — M0569 Rheumatoid arthritis of multiple sites with involvement of other organs and systems: Secondary | ICD-10-CM | POA: Diagnosis not present

## 2021-03-25 DIAGNOSIS — Z79899 Other long term (current) drug therapy: Secondary | ICD-10-CM | POA: Diagnosis not present

## 2021-03-30 ENCOUNTER — Other Ambulatory Visit: Payer: Self-pay | Admitting: Physician Assistant

## 2021-03-30 ENCOUNTER — Other Ambulatory Visit: Payer: Self-pay | Admitting: *Deleted

## 2021-03-30 DIAGNOSIS — Z79899 Other long term (current) drug therapy: Secondary | ICD-10-CM | POA: Diagnosis not present

## 2021-03-30 LAB — CBC WITH DIFFERENTIAL/PLATELET
Absolute Monocytes: 659 cells/uL (ref 200–950)
Basophils Absolute: 18 cells/uL (ref 0–200)
Basophils Relative: 0.3 %
Eosinophils Absolute: 159 cells/uL (ref 15–500)
Eosinophils Relative: 2.6 %
HCT: 40.6 % (ref 35.0–45.0)
Hemoglobin: 13.5 g/dL (ref 11.7–15.5)
Lymphs Abs: 2391 cells/uL (ref 850–3900)
MCH: 29.1 pg (ref 27.0–33.0)
MCHC: 33.3 g/dL (ref 32.0–36.0)
MCV: 87.5 fL (ref 80.0–100.0)
MPV: 10.8 fL (ref 7.5–12.5)
Monocytes Relative: 10.8 %
Neutro Abs: 2873 cells/uL (ref 1500–7800)
Neutrophils Relative %: 47.1 %
Platelets: 228 10*3/uL (ref 140–400)
RBC: 4.64 10*6/uL (ref 3.80–5.10)
RDW: 12.3 % (ref 11.0–15.0)
Total Lymphocyte: 39.2 %
WBC: 6.1 10*3/uL (ref 3.8–10.8)

## 2021-03-30 LAB — COMPLETE METABOLIC PANEL WITH GFR
AG Ratio: 1.5 (calc) (ref 1.0–2.5)
ALT: 25 U/L (ref 6–29)
AST: 29 U/L (ref 10–35)
Albumin: 4.5 g/dL (ref 3.6–5.1)
Alkaline phosphatase (APISO): 48 U/L (ref 37–153)
BUN: 14 mg/dL (ref 7–25)
CO2: 26 mmol/L (ref 20–32)
Calcium: 8.9 mg/dL (ref 8.6–10.4)
Chloride: 106 mmol/L (ref 98–110)
Creat: 0.72 mg/dL (ref 0.50–1.03)
Globulin: 3 g/dL (calc) (ref 1.9–3.7)
Glucose, Bld: 77 mg/dL (ref 65–99)
Potassium: 4.4 mmol/L (ref 3.5–5.3)
Sodium: 140 mmol/L (ref 135–146)
Total Bilirubin: 0.3 mg/dL (ref 0.2–1.2)
Total Protein: 7.5 g/dL (ref 6.1–8.1)
eGFR: 98 mL/min/{1.73_m2} (ref >=60)

## 2021-03-30 NOTE — Telephone Encounter (Signed)
Next Visit: 04/22/2021  Last Visit: 10/22/2020  Labs: 10/19/2020 CBC and CMP are normal.  Eye exam: 03/25/2021 WNL   Current Dose per office note 10/22/2020: Plaquenil 200 mg 1 tablet by mouth daily  [id number] rheumatoid arthritis  Last Fill: 11/11/2020  Patient will update labs this week.   Okay to refill Plaquenil?

## 2021-03-31 ENCOUNTER — Other Ambulatory Visit: Payer: Self-pay | Admitting: Allergy and Immunology

## 2021-04-06 ENCOUNTER — Other Ambulatory Visit: Payer: Self-pay | Admitting: *Deleted
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
## 2021-04-13 DIAGNOSIS — M25373 Other instability, unspecified ankle: Secondary | ICD-10-CM | POA: Diagnosis not present

## 2021-04-13 DIAGNOSIS — M7752 Other enthesopathy of left foot: Secondary | ICD-10-CM | POA: Diagnosis not present

## 2021-04-13 DIAGNOSIS — M7751 Other enthesopathy of right foot: Secondary | ICD-10-CM | POA: Diagnosis not present

## 2021-04-22 ENCOUNTER — Ambulatory Visit (INDEPENDENT_AMBULATORY_CARE_PROVIDER_SITE_OTHER): Payer: BC Managed Care – PPO | Admitting: Physician Assistant

## 2021-04-22 ENCOUNTER — Other Ambulatory Visit: Payer: Self-pay

## 2021-04-22 ENCOUNTER — Ambulatory Visit: Payer: Self-pay

## 2021-04-22 ENCOUNTER — Encounter: Payer: Self-pay | Admitting: Physician Assistant

## 2021-04-22 VITALS — BP 115/71 | HR 75 | Resp 15 | Ht 64.0 in | Wt 195.0 lb

## 2021-04-22 DIAGNOSIS — Z8669 Personal history of other diseases of the nervous system and sense organs: Secondary | ICD-10-CM

## 2021-04-22 DIAGNOSIS — M19042 Primary osteoarthritis, left hand: Secondary | ICD-10-CM

## 2021-04-22 DIAGNOSIS — Z8639 Personal history of other endocrine, nutritional and metabolic disease: Secondary | ICD-10-CM

## 2021-04-22 DIAGNOSIS — M25511 Pain in right shoulder: Secondary | ICD-10-CM | POA: Diagnosis not present

## 2021-04-22 DIAGNOSIS — M7061 Trochanteric bursitis, right hip: Secondary | ICD-10-CM

## 2021-04-22 DIAGNOSIS — Z79899 Other long term (current) drug therapy: Secondary | ICD-10-CM | POA: Diagnosis not present

## 2021-04-22 DIAGNOSIS — M19041 Primary osteoarthritis, right hand: Secondary | ICD-10-CM

## 2021-04-22 DIAGNOSIS — M79671 Pain in right foot: Secondary | ICD-10-CM | POA: Diagnosis not present

## 2021-04-22 DIAGNOSIS — M3500 Sicca syndrome, unspecified: Secondary | ICD-10-CM

## 2021-04-22 DIAGNOSIS — M7062 Trochanteric bursitis, left hip: Secondary | ICD-10-CM

## 2021-04-22 DIAGNOSIS — K219 Gastro-esophageal reflux disease without esophagitis: Secondary | ICD-10-CM

## 2021-04-22 DIAGNOSIS — M797 Fibromyalgia: Secondary | ICD-10-CM

## 2021-04-22 DIAGNOSIS — G8929 Other chronic pain: Secondary | ICD-10-CM

## 2021-04-22 DIAGNOSIS — M059 Rheumatoid arthritis with rheumatoid factor, unspecified: Secondary | ICD-10-CM | POA: Diagnosis not present

## 2021-04-22 DIAGNOSIS — Z8719 Personal history of other diseases of the digestive system: Secondary | ICD-10-CM

## 2021-04-22 DIAGNOSIS — M79672 Pain in left foot: Secondary | ICD-10-CM | POA: Diagnosis not present

## 2021-04-22 DIAGNOSIS — D802 Selective deficiency of immunoglobulin A [IgA]: Secondary | ICD-10-CM

## 2021-04-22 DIAGNOSIS — I471 Supraventricular tachycardia: Secondary | ICD-10-CM

## 2021-04-22 DIAGNOSIS — J4541 Moderate persistent asthma with (acute) exacerbation: Secondary | ICD-10-CM

## 2021-04-22 NOTE — Progress Notes (Signed)
Please notify the patient of x-rays results:   No baseline x-rays were on file for comparison.    Left hand-possible erosion vs. Cystic changes noted at base of 5th metacarpal.  Findings consistent with RA.

## 2021-04-22 NOTE — Progress Notes (Signed)
X-rays of both feet are consistent with osteoarthritis.    X-rays of the right hand were consistent with rheumatoid arthritis.  No erosive changes were noted.

## 2021-04-30 ENCOUNTER — Telehealth: Payer: Self-pay | Admitting: *Deleted

## 2021-04-30 MED ORDER — MONTELUKAST SODIUM 10 MG PO TABS: 10.0000 mg | ORAL_TABLET | Freq: Every day | ORAL | 1 refills | Status: DC

## 2021-04-30 NOTE — Telephone Encounter (Signed)
Refill for Montelukast.

## 2021-05-24 ENCOUNTER — Other Ambulatory Visit: Payer: Self-pay | Admitting: *Deleted

## 2021-05-26 ENCOUNTER — Encounter: Payer: Self-pay | Admitting: Allergy and Immunology

## 2021-06-08 DIAGNOSIS — Z01419 Encounter for gynecological examination (general) (routine) without abnormal findings: Secondary | ICD-10-CM | POA: Diagnosis not present

## 2021-06-08 DIAGNOSIS — Z6833 Body mass index (BMI) 33.0-33.9, adult: Secondary | ICD-10-CM | POA: Diagnosis not present

## 2021-06-11 ENCOUNTER — Other Ambulatory Visit: Payer: Self-pay | Admitting: *Deleted

## 2021-06-11 MED ORDER — MONTELUKAST SODIUM 10 MG PO TABS: ORAL_TABLET | ORAL | 0 refills | Status: DC

## 2021-06-12 ENCOUNTER — Other Ambulatory Visit: Payer: Self-pay | Admitting: Physician Assistant

## 2021-06-14 NOTE — Telephone Encounter (Signed)
Next Visit: 09/23/2021 ? ?Last Visit: 04/22/2021 ? ?Labs: 03/30/2021 CBC and CMP WNL ? ?Eye exam: 12/15/2022WNL   ? ?Current Dose per office note 04/22/2021: Plaquenil 200 mg 1 tablet by mouth daily ? ?DX: Seropositive rheumatoid arthritis  ? ?Last Fill: 03/30/2021 ? ?Okay to refill Plaquenil?  ?

## 2021-07-06 ENCOUNTER — Ambulatory Visit: Payer: BC Managed Care – PPO | Admitting: Allergy and Immunology

## 2021-07-06 ENCOUNTER — Other Ambulatory Visit: Payer: Self-pay

## 2021-07-06 VITALS — BP 122/76 | HR 78 | Temp 97.8°F | Resp 16 | Ht 64.0 in | Wt 189.6 lb

## 2021-07-06 DIAGNOSIS — D802 Selective deficiency of immunoglobulin A [IgA]: Secondary | ICD-10-CM

## 2021-07-06 DIAGNOSIS — E8801 Alpha-1-antitrypsin deficiency: Secondary | ICD-10-CM | POA: Diagnosis not present

## 2021-07-06 DIAGNOSIS — J4550 Severe persistent asthma, uncomplicated: Secondary | ICD-10-CM

## 2021-07-06 DIAGNOSIS — J3089 Other allergic rhinitis: Secondary | ICD-10-CM | POA: Diagnosis not present

## 2021-07-06 DIAGNOSIS — K219 Gastro-esophageal reflux disease without esophagitis: Secondary | ICD-10-CM | POA: Diagnosis not present

## 2021-07-06 DIAGNOSIS — Z91038 Other insect allergy status: Secondary | ICD-10-CM

## 2021-07-06 MED ORDER — MONTELUKAST SODIUM 10 MG PO TABS: ORAL_TABLET | ORAL | 1 refills | Status: DC

## 2021-07-06 MED ORDER — IPRATROPIUM BROMIDE 0.06 % NA SOLN: NASAL | 1 refills | Status: DC

## 2021-07-06 MED ORDER — OMEPRAZOLE 40 MG PO CPDR: 40.0000 mg | DELAYED_RELEASE_CAPSULE | Freq: Two times a day (BID) | ORAL | 1 refills | Status: DC

## 2021-07-06 MED ORDER — SODIUM BICARBONATE 650 MG PO TABS: 1300.0000 mg | ORAL_TABLET | Freq: Two times a day (BID) | ORAL | 1 refills | Status: DC

## 2021-07-06 NOTE — Patient Instructions (Addendum)
?  1. Continue to Treat and prevent inflammation: ? ? A. Montelukast 10mg  1 tablet 1 time per day ? B. Dupilumab injections every 2 weeks  ? ?2. Continue to treat and prevent reflux: ?  ? A.  Generic Omeprazole 40 mg + bicarbonate 1-2 times a day ? ?3.  If needed: ? ? A. ipratropium 0.06% 2 sprays each nostril every 6 hours  ? B. Rhinocort / budesonide 1-2 sprays each nostril 1-7 times per week ? C. nasal saline wash ? D. pro-air respiclick or albuterol nebulization ? E. over-the-counter antihistamine / Mucinex DM  ? F. EpiPen / Auvi-Q 0.3 ? G. Breztri - 2 inhalations 2 times per day  ? ?4. Return to clinic 6 months or earlier if problem ? ? ? ?
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
## 2021-07-07 ENCOUNTER — Encounter: Payer: Self-pay | Admitting: Allergy and Immunology

## 2021-07-08 ENCOUNTER — Other Ambulatory Visit: Payer: Self-pay | Admitting: *Deleted

## 2021-07-08 MED ORDER — DUPIXENT 300 MG/2ML ~~LOC~~ SOSY: PREFILLED_SYRINGE | SUBCUTANEOUS | 11 refills | Status: DC

## 2021-07-12 ENCOUNTER — Other Ambulatory Visit: Payer: Self-pay | Admitting: *Deleted

## 2021-07-12 DIAGNOSIS — L578 Other skin changes due to chronic exposure to nonionizing radiation: Secondary | ICD-10-CM | POA: Diagnosis not present

## 2021-07-12 DIAGNOSIS — Z85828 Personal history of other malignant neoplasm of skin: Secondary | ICD-10-CM | POA: Diagnosis not present

## 2021-07-12 DIAGNOSIS — L814 Other melanin hyperpigmentation: Secondary | ICD-10-CM | POA: Diagnosis not present

## 2021-07-12 DIAGNOSIS — L570 Actinic keratosis: Secondary | ICD-10-CM | POA: Diagnosis not present

## 2021-07-12 DIAGNOSIS — X32XXXS Exposure to sunlight, sequela: Secondary | ICD-10-CM | POA: Diagnosis not present

## 2021-07-12 MED ORDER — OMEPRAZOLE 40 MG PO CPDR: 40.0000 mg | DELAYED_RELEASE_CAPSULE | Freq: Two times a day (BID) | ORAL | 1 refills | Status: DC

## 2021-07-21 DIAGNOSIS — F411 Generalized anxiety disorder: Secondary | ICD-10-CM | POA: Diagnosis not present

## 2021-08-12 DIAGNOSIS — H9041 Sensorineural hearing loss, unilateral, right ear, with unrestricted hearing on the contralateral side: Secondary | ICD-10-CM | POA: Diagnosis not present

## 2021-09-01 DIAGNOSIS — F411 Generalized anxiety disorder: Secondary | ICD-10-CM | POA: Diagnosis not present
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
## 2021-09-20 ENCOUNTER — Other Ambulatory Visit: Payer: Self-pay | Admitting: Physician Assistant

## 2021-09-20 NOTE — Telephone Encounter (Signed)
Next Visit: 09/23/2021  Last Visit: 04/22/2021  Labs: 03/30/2021  CBC and CMP WNL  Eye exam: 03/25/2021   Current Dose per office note 04/22/2021: Plaquenil 200 mg 1 tablet by mouth daily  DX: Seropositive rheumatoid arthritis   Last Fill: 06/14/2021  Okay to refill Plaquenil?

## 2021-09-23 ENCOUNTER — Encounter: Payer: Self-pay | Admitting: Rheumatology

## 2021-09-23 ENCOUNTER — Ambulatory Visit: Payer: BC Managed Care – PPO | Admitting: Rheumatology

## 2021-09-23 ENCOUNTER — Telehealth: Payer: Self-pay | Admitting: *Deleted

## 2021-09-23 VITALS — BP 102/64 | HR 73 | Resp 15 | Ht 64.0 in | Wt 195.0 lb

## 2021-09-23 DIAGNOSIS — Z79899 Other long term (current) drug therapy: Secondary | ICD-10-CM | POA: Diagnosis not present

## 2021-09-23 DIAGNOSIS — R5383 Other fatigue: Secondary | ICD-10-CM | POA: Diagnosis not present

## 2021-09-23 DIAGNOSIS — M797 Fibromyalgia: Secondary | ICD-10-CM

## 2021-09-23 DIAGNOSIS — Z8719 Personal history of other diseases of the digestive system: Secondary | ICD-10-CM

## 2021-09-23 DIAGNOSIS — D802 Selective deficiency of immunoglobulin A [IgA]: Secondary | ICD-10-CM

## 2021-09-23 DIAGNOSIS — Z8669 Personal history of other diseases of the nervous system and sense organs: Secondary | ICD-10-CM

## 2021-09-23 DIAGNOSIS — I471 Supraventricular tachycardia, unspecified: Secondary | ICD-10-CM

## 2021-09-23 DIAGNOSIS — K219 Gastro-esophageal reflux disease without esophagitis: Secondary | ICD-10-CM

## 2021-09-23 DIAGNOSIS — M3500 Sicca syndrome, unspecified: Secondary | ICD-10-CM

## 2021-09-23 DIAGNOSIS — M25511 Pain in right shoulder: Secondary | ICD-10-CM | POA: Diagnosis not present

## 2021-09-23 DIAGNOSIS — M19041 Primary osteoarthritis, right hand: Secondary | ICD-10-CM

## 2021-09-23 DIAGNOSIS — M7061 Trochanteric bursitis, right hip: Secondary | ICD-10-CM

## 2021-09-23 DIAGNOSIS — M7062 Trochanteric bursitis, left hip: Secondary | ICD-10-CM

## 2021-09-23 DIAGNOSIS — M059 Rheumatoid arthritis with rheumatoid factor, unspecified: Secondary | ICD-10-CM | POA: Diagnosis not present

## 2021-09-23 DIAGNOSIS — J4541 Moderate persistent asthma with (acute) exacerbation: Secondary | ICD-10-CM

## 2021-09-23 DIAGNOSIS — G8929 Other chronic pain: Secondary | ICD-10-CM

## 2021-09-23 DIAGNOSIS — M19042 Primary osteoarthritis, left hand: Secondary | ICD-10-CM

## 2021-09-23 DIAGNOSIS — Z8639 Personal history of other endocrine, nutritional and metabolic disease: Secondary | ICD-10-CM

## 2021-09-23 NOTE — Patient Instructions (Signed)
Vaccines You are taking a medication(s) that can suppress your immune system.  The following immunizations are recommended: Flu annually Covid-19  Td/Tdap (tetanus, diphtheria, pertussis) every 10 years Pneumonia (Prevnar 15 then Pneumovax 23 at least 1 year apart.  Alternatively, can take Prevnar 20 without needing additional dose) Shingrix: 2 doses from 4 weeks to 6 months apart  Please check with your PCP to make sure you are up to date.  

## 2021-09-23 NOTE — Telephone Encounter (Signed)
Per Dr. Corliss Skains, refer patient to integrative therapies for FMS.

## 2021-09-24 LAB — CBC WITH DIFFERENTIAL/PLATELET
Absolute Monocytes: 455 cells/uL (ref 200–950)
Basophils Absolute: 9 cells/uL (ref 0–200)
Basophils Relative: 0.2 %
Eosinophils Absolute: 90 cells/uL (ref 15–500)
Eosinophils Relative: 2 %
HCT: 40.7 % (ref 35.0–45.0)
Hemoglobin: 13.3 g/dL (ref 11.7–15.5)
Lymphs Abs: 1710 cells/uL (ref 850–3900)
MCH: 29.6 pg (ref 27.0–33.0)
MCHC: 32.7 g/dL (ref 32.0–36.0)
MCV: 90.6 fL (ref 80.0–100.0)
MPV: 11.2 fL (ref 7.5–12.5)
Monocytes Relative: 10.1 %
Neutro Abs: 2237 cells/uL (ref 1500–7800)
Neutrophils Relative %: 49.7 %
Platelets: 201 10*3/uL (ref 140–400)
RBC: 4.49 10*6/uL (ref 3.80–5.10)
RDW: 12.8 % (ref 11.0–15.0)
Total Lymphocyte: 38 %
WBC: 4.5 10*3/uL (ref 3.8–10.8)

## 2021-09-24 LAB — COMPLETE METABOLIC PANEL WITH GFR
AG Ratio: 1.5 (calc) (ref 1.0–2.5)
ALT: 15 U/L (ref 6–29)
AST: 22 U/L (ref 10–35)
Albumin: 4.3 g/dL (ref 3.6–5.1)
Alkaline phosphatase (APISO): 46 U/L (ref 37–153)
BUN: 15 mg/dL (ref 7–25)
CO2: 28 mmol/L (ref 20–32)
Calcium: 9.4 mg/dL (ref 8.6–10.4)
Chloride: 108 mmol/L (ref 98–110)
Creat: 0.77 mg/dL (ref 0.50–1.03)
Globulin: 2.9 g/dL (calc) (ref 1.9–3.7)
Glucose, Bld: 61 mg/dL — ABNORMAL LOW (ref 65–99)
Potassium: 4.5 mmol/L (ref 3.5–5.3)
Sodium: 142 mmol/L (ref 135–146)
Total Bilirubin: 0.3 mg/dL (ref 0.2–1.2)
Total Protein: 7.2 g/dL (ref 6.1–8.1)
eGFR: 90 mL/min/{1.73_m2} (ref >=60)

## 2021-09-24 LAB — TSH: TSH: 1.78 mIU/L (ref 0.40–4.50)

## 2021-09-24 NOTE — Progress Notes (Signed)
CBC, CMP and TSH are within normal limits.

## 2021-09-25 ENCOUNTER — Other Ambulatory Visit: Payer: Self-pay | Admitting: Allergy and Immunology

## 2021-09-30 DIAGNOSIS — F411 Generalized anxiety disorder: Secondary | ICD-10-CM | POA: Diagnosis not present

## 2021-10-13 ENCOUNTER — Encounter: Payer: Self-pay | Admitting: Rheumatology

## 2021-10-13 ENCOUNTER — Other Ambulatory Visit: Payer: Self-pay | Admitting: *Deleted

## 2021-10-13 MED ORDER — HYDROXYCHLOROQUINE SULFATE 200 MG PO TABS: ORAL_TABLET | ORAL | 0 refills | Status: DC

## 2021-10-18 ENCOUNTER — Other Ambulatory Visit: Payer: Self-pay | Admitting: Allergy and Immunology

## 2021-10-21 DIAGNOSIS — Z1231 Encounter for screening mammogram for malignant neoplasm of breast: Secondary | ICD-10-CM | POA: Diagnosis not present

## 2021-11-04 DIAGNOSIS — R140 Abdominal distension (gaseous): Secondary | ICD-10-CM | POA: Diagnosis not present

## 2021-11-09 ENCOUNTER — Encounter: Payer: Self-pay | Admitting: Allergy and Immunology

## 2021-12-07 DIAGNOSIS — F411 Generalized anxiety disorder: Secondary | ICD-10-CM | POA: Diagnosis not present

## 2021-12-09 ENCOUNTER — Other Ambulatory Visit: Payer: Self-pay | Admitting: Allergy and Immunology

## 2021-12-09 ENCOUNTER — Encounter: Payer: Self-pay | Admitting: Allergy and Immunology

## 2021-12-23 DIAGNOSIS — F411 Generalized anxiety disorder: Secondary | ICD-10-CM | POA: Diagnosis not present

## 2022-01-18 ENCOUNTER — Encounter: Payer: Self-pay | Admitting: Allergy and Immunology

## 2022-01-18 ENCOUNTER — Ambulatory Visit: Payer: BC Managed Care – PPO | Admitting: Allergy and Immunology

## 2022-01-18 ENCOUNTER — Other Ambulatory Visit: Payer: Self-pay

## 2022-01-18 VITALS — BP 120/82 | HR 95 | Temp 97.9°F | Resp 18 | Ht 64.0 in | Wt 193.2 lb

## 2022-01-18 DIAGNOSIS — J4550 Severe persistent asthma, uncomplicated: Secondary | ICD-10-CM

## 2022-01-18 DIAGNOSIS — Z91038 Other insect allergy status: Secondary | ICD-10-CM

## 2022-01-18 DIAGNOSIS — J3089 Other allergic rhinitis: Secondary | ICD-10-CM | POA: Diagnosis not present

## 2022-01-18 DIAGNOSIS — E8801 Alpha-1-antitrypsin deficiency: Secondary | ICD-10-CM | POA: Diagnosis not present

## 2022-01-18 DIAGNOSIS — K219 Gastro-esophageal reflux disease without esophagitis: Secondary | ICD-10-CM | POA: Diagnosis not present

## 2022-01-18 DIAGNOSIS — D802 Selective deficiency of immunoglobulin A [IgA]: Secondary | ICD-10-CM

## 2022-01-18 MED ORDER — BREZTRI AEROSPHERE 160-9-4.8 MCG/ACT IN AERO: 2.0000 | INHALATION_SPRAY | Freq: Two times a day (BID) | RESPIRATORY_TRACT | 5 refills | Status: DC

## 2022-01-18 MED ORDER — IPRATROPIUM BROMIDE 0.06 % NA SOLN: NASAL | 1 refills | Status: DC

## 2022-01-18 MED ORDER — OMEPRAZOLE 40 MG PO CPDR: 40.0000 mg | DELAYED_RELEASE_CAPSULE | Freq: Two times a day (BID) | ORAL | 1 refills | Status: DC

## 2022-01-18 MED ORDER — MONTELUKAST SODIUM 10 MG PO TABS: 10.0000 mg | ORAL_TABLET | Freq: Every day | ORAL | 1 refills | Status: DC

## 2022-01-18 MED ORDER — ALBUTEROL SULFATE HFA 108 (90 BASE) MCG/ACT IN AERS: 2.0000 | INHALATION_SPRAY | Freq: Four times a day (QID) | RESPIRATORY_TRACT | 2 refills | Status: DC | PRN
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
## 2022-01-18 NOTE — Patient Instructions (Addendum)
  1. Continue to Treat and prevent inflammation:   A. Montelukast 10mg  1 tablet 1 time per day  B. Dupilumab injections every 2 weeks   C. Breztri -2 inhalations 1-2 times per day  2. Continue to treat and prevent reflux:    A.  Generic Omeprazole 40 mg + bicarbonate 2 times a day  3.  If needed:   A. ipratropium 0.06% 2 sprays each nostril every 6 hours   B. Rhinocort / budesonide 1-2 sprays each nostril 1-7 times per week  C. nasal saline wash  D. pro-air respiclick or albuterol nebulization  E. over-the-counter antihistamine / Mucinex DM   F. EpiPen / Auvi-Q 0.3  4.  Blood -alpha-1 antitrypsin level  5. Return to clinic 6 months or earlier if problem  6.  Obtain fall flu vaccine

## 2022-01-19 ENCOUNTER — Encounter: Payer: Self-pay | Admitting: Allergy and Immunology

## 2022-01-19 LAB — ALPHA-1-ANTITRYPSIN: A-1 Antitrypsin: 140 mg/dL (ref 101–187)

## 2022-01-25 ENCOUNTER — Encounter: Payer: Self-pay | Admitting: Allergy and Immunology

## 2022-01-25 ENCOUNTER — Other Ambulatory Visit: Payer: Self-pay | Admitting: *Deleted

## 2022-01-25 MED ORDER — ALBUTEROL SULFATE HFA 108 (90 BASE) MCG/ACT IN AERS: 2.0000 | INHALATION_SPRAY | Freq: Four times a day (QID) | RESPIRATORY_TRACT | 1 refills | Status: AC | PRN

## 2022-01-26 ENCOUNTER — Other Ambulatory Visit: Payer: Self-pay | Admitting: *Deleted

## 2022-01-26 MED ORDER — ALBUTEROL SULFATE HFA 108 (90 BASE) MCG/ACT IN AERS: 2.0000 | INHALATION_SPRAY | RESPIRATORY_TRACT | 1 refills | Status: AC | PRN

## 2022-02-02 DIAGNOSIS — R140 Abdominal distension (gaseous): Secondary | ICD-10-CM | POA: Diagnosis not present
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
## 2022-02-10 DIAGNOSIS — G43019 Migraine without aura, intractable, without status migrainosus: Secondary | ICD-10-CM | POA: Diagnosis not present

## 2022-02-23 ENCOUNTER — Ambulatory Visit: Payer: BC Managed Care – PPO | Attending: Physician Assistant | Admitting: Physician Assistant

## 2022-02-23 ENCOUNTER — Encounter: Payer: Self-pay | Admitting: Physician Assistant

## 2022-02-23 VITALS — BP 111/74 | HR 74 | Resp 15 | Ht 64.0 in | Wt 187.8 lb

## 2022-02-23 DIAGNOSIS — Z79899 Other long term (current) drug therapy: Secondary | ICD-10-CM

## 2022-02-23 DIAGNOSIS — M7061 Trochanteric bursitis, right hip: Secondary | ICD-10-CM

## 2022-02-23 DIAGNOSIS — M25511 Pain in right shoulder: Secondary | ICD-10-CM | POA: Diagnosis not present

## 2022-02-23 DIAGNOSIS — J4541 Moderate persistent asthma with (acute) exacerbation: Secondary | ICD-10-CM

## 2022-02-23 DIAGNOSIS — M7062 Trochanteric bursitis, left hip: Secondary | ICD-10-CM

## 2022-02-23 DIAGNOSIS — M3500 Sicca syndrome, unspecified: Secondary | ICD-10-CM

## 2022-02-23 DIAGNOSIS — M19041 Primary osteoarthritis, right hand: Secondary | ICD-10-CM

## 2022-02-23 DIAGNOSIS — D802 Selective deficiency of immunoglobulin A [IgA]: Secondary | ICD-10-CM

## 2022-02-23 DIAGNOSIS — Z8719 Personal history of other diseases of the digestive system: Secondary | ICD-10-CM

## 2022-02-23 DIAGNOSIS — Z8639 Personal history of other endocrine, nutritional and metabolic disease: Secondary | ICD-10-CM

## 2022-02-23 DIAGNOSIS — M059 Rheumatoid arthritis with rheumatoid factor, unspecified: Secondary | ICD-10-CM | POA: Diagnosis not present

## 2022-02-23 DIAGNOSIS — M19042 Primary osteoarthritis, left hand: Secondary | ICD-10-CM

## 2022-02-23 DIAGNOSIS — I471 Supraventricular tachycardia, unspecified: Secondary | ICD-10-CM

## 2022-02-23 DIAGNOSIS — K219 Gastro-esophageal reflux disease without esophagitis: Secondary | ICD-10-CM

## 2022-02-23 DIAGNOSIS — G8929 Other chronic pain: Secondary | ICD-10-CM

## 2022-02-23 DIAGNOSIS — M797 Fibromyalgia: Secondary | ICD-10-CM

## 2022-02-23 DIAGNOSIS — Z8669 Personal history of other diseases of the nervous system and sense organs: Secondary | ICD-10-CM

## 2022-02-23 DIAGNOSIS — R5383 Other fatigue: Secondary | ICD-10-CM

## 2022-02-23 LAB — COMPLETE METABOLIC PANEL WITH GFR
AG Ratio: 1.6 (calc) (ref 1.0–2.5)
ALT: 13 U/L (ref 6–29)
AST: 20 U/L (ref 10–35)
Albumin: 4.5 g/dL (ref 3.6–5.1)
Alkaline phosphatase (APISO): 42 U/L (ref 37–153)
BUN: 15 mg/dL (ref 7–25)
CO2: 28 mmol/L (ref 20–32)
Calcium: 9.3 mg/dL (ref 8.6–10.4)
Chloride: 103 mmol/L (ref 98–110)
Creat: 0.84 mg/dL (ref 0.50–1.03)
Globulin: 2.9 g/dL (calc) (ref 1.9–3.7)
Glucose, Bld: 82 mg/dL (ref 65–99)
Potassium: 4.4 mmol/L (ref 3.5–5.3)
Sodium: 138 mmol/L (ref 135–146)
Total Bilirubin: 0.4 mg/dL (ref 0.2–1.2)
Total Protein: 7.4 g/dL (ref 6.1–8.1)
eGFR: 82 mL/min/{1.73_m2} (ref >=60)

## 2022-02-23 LAB — CBC WITH DIFFERENTIAL/PLATELET
Absolute Monocytes: 470 cells/uL (ref 200–950)
Basophils Absolute: 10 cells/uL (ref 0–200)
Basophils Relative: 0.2 %
Eosinophils Absolute: 82 cells/uL (ref 15–500)
Eosinophils Relative: 1.7 %
HCT: 40.7 % (ref 35.0–45.0)
Hemoglobin: 13.7 g/dL (ref 11.7–15.5)
Lymphs Abs: 1934 cells/uL (ref 850–3900)
MCH: 29.1 pg (ref 27.0–33.0)
MCHC: 33.7 g/dL (ref 32.0–36.0)
MCV: 86.6 fL (ref 80.0–100.0)
MPV: 10.7 fL (ref 7.5–12.5)
Monocytes Relative: 9.8 %
Neutro Abs: 2304 cells/uL (ref 1500–7800)
Neutrophils Relative %: 48 %
Platelets: 248 10*3/uL (ref 140–400)
RBC: 4.7 10*6/uL (ref 3.80–5.10)
RDW: 12.4 % (ref 11.0–15.0)
Total Lymphocyte: 40.3 %
WBC: 4.8 10*3/uL (ref 3.8–10.8)

## 2022-02-24 ENCOUNTER — Ambulatory Visit: Payer: BC Managed Care – PPO | Admitting: Physician Assistant

## 2022-02-24 NOTE — Progress Notes (Signed)
CBC and CMP are normal.

## 2022-03-14 DIAGNOSIS — Z79899 Other long term (current) drug therapy: Secondary | ICD-10-CM | POA: Diagnosis not present

## 2022-03-14 DIAGNOSIS — M0569 Rheumatoid arthritis of multiple sites with involvement of other organs and systems: Secondary | ICD-10-CM | POA: Diagnosis not present

## 2022-03-16 DIAGNOSIS — F411 Generalized anxiety disorder: Secondary | ICD-10-CM | POA: Diagnosis not present

## 2022-03-31 ENCOUNTER — Other Ambulatory Visit: Payer: Self-pay | Admitting: *Deleted

## 2022-03-31 MED ORDER — HYDROXYCHLOROQUINE SULFATE 200 MG PO TABS: ORAL_TABLET | ORAL | 0 refills | Status: DC

## 2022-03-31 MED ORDER — HYDROXYCHLOROQUINE SULFATE 200 MG PO TABS: ORAL_TABLET | ORAL | 0 refills | Status: AC

## 2022-03-31 NOTE — Progress Notes (Signed)
Plaquenil eye exam updated on 03/14/22.

## 2022-03-31 NOTE — Telephone Encounter (Signed)
Next Visit: 08/10/2022  Last Visit: 02/23/2022  Labs: 02/23/2022 CBC and CMP are normal.   Eye exam: 03/14/2022 WNL   Current Dose per office note 02/23/2022: Plaquenil 200 mg 2 tablets on Tuesday, Thursday, and Saturday and 1 tablet every other day.    BO:[id number] rheumatoid arthritis   Last Fill: 10/13/2021  Okay to refill Plaquenil?

## 2022-03-31 NOTE — Addendum Note (Signed)
Addended by: Henriette Combs on: 03/31/2022 01:46 PM   Modules accepted: Orders

## 2022-04-21 ENCOUNTER — Other Ambulatory Visit: Payer: Self-pay | Admitting: Allergy and Immunology

## 2022-05-03 DIAGNOSIS — M545 Low back pain, unspecified: Secondary | ICD-10-CM | POA: Diagnosis not present

## 2022-05-06 DIAGNOSIS — F411 Generalized anxiety disorder: Secondary | ICD-10-CM | POA: Diagnosis not present

## 2022-05-17 DIAGNOSIS — M545 Low back pain, unspecified: Secondary | ICD-10-CM | POA: Diagnosis not present

## 2022-05-24 DIAGNOSIS — M545 Low back pain, unspecified: Secondary | ICD-10-CM | POA: Diagnosis not present

## 2022-05-26 DIAGNOSIS — F411 Generalized anxiety disorder: Secondary | ICD-10-CM | POA: Diagnosis not present

## 2022-06-27 ENCOUNTER — Other Ambulatory Visit: Payer: Self-pay | Admitting: Allergy and Immunology

## 2022-06-28 DIAGNOSIS — F411 Generalized anxiety disorder: Secondary | ICD-10-CM | POA: Diagnosis not present

## 2022-07-13 DIAGNOSIS — D225 Melanocytic nevi of trunk: Secondary | ICD-10-CM | POA: Diagnosis not present

## 2022-07-13 DIAGNOSIS — Z85828 Personal history of other malignant neoplasm of skin: Secondary | ICD-10-CM | POA: Diagnosis not present

## 2022-07-13 DIAGNOSIS — L218 Other seborrheic dermatitis: Secondary | ICD-10-CM | POA: Diagnosis not present

## 2022-07-13 DIAGNOSIS — L570 Actinic keratosis: Secondary | ICD-10-CM | POA: Diagnosis not present

## 2022-07-13 DIAGNOSIS — L821 Other seborrheic keratosis: Secondary | ICD-10-CM | POA: Diagnosis not present

## 2022-07-19 ENCOUNTER — Ambulatory Visit: Payer: BC Managed Care – PPO | Admitting: Allergy and Immunology

## 2022-07-19 VITALS — BP 114/68 | HR 71 | Temp 98.1°F | Resp 16 | Ht 64.0 in | Wt 190.5 lb

## 2022-07-19 DIAGNOSIS — J3089 Other allergic rhinitis: Secondary | ICD-10-CM

## 2022-07-19 DIAGNOSIS — D802 Selective deficiency of immunoglobulin A [IgA]: Secondary | ICD-10-CM

## 2022-07-19 DIAGNOSIS — K219 Gastro-esophageal reflux disease without esophagitis: Secondary | ICD-10-CM

## 2022-07-19 DIAGNOSIS — J4550 Severe persistent asthma, uncomplicated: Secondary | ICD-10-CM

## 2022-07-19 DIAGNOSIS — E8801 Alpha-1-antitrypsin deficiency: Secondary | ICD-10-CM | POA: Diagnosis not present

## 2022-07-19 DIAGNOSIS — Z91038 Other insect allergy status: Secondary | ICD-10-CM

## 2022-07-19 MED ORDER — AIRSUPRA 90-80 MCG/ACT IN AERO: 2.0000 | INHALATION_SPRAY | RESPIRATORY_TRACT | 1 refills | Status: AC | PRN

## 2022-07-19 MED ORDER — BREZTRI AEROSPHERE 160-9-4.8 MCG/ACT IN AERO: 2.0000 | INHALATION_SPRAY | Freq: Two times a day (BID) | RESPIRATORY_TRACT | 3 refills | Status: AC

## 2022-07-19 MED ORDER — OMEPRAZOLE 40 MG PO CPDR: 40.0000 mg | DELAYED_RELEASE_CAPSULE | Freq: Two times a day (BID) | ORAL | 3 refills | Status: AC

## 2022-07-19 MED ORDER — DESONIDE 0.05 % EX CREA: 1.0000 | TOPICAL_CREAM | Freq: Two times a day (BID) | CUTANEOUS | 1 refills | Status: AC

## 2022-07-19 MED ORDER — EPINEPHRINE 0.3 MG/0.3ML IJ SOAJ: 0.3000 mg | INTRAMUSCULAR | 1 refills | Status: AC | PRN

## 2022-07-19 MED ORDER — MONTELUKAST SODIUM 10 MG PO TABS: 10.0000 mg | ORAL_TABLET | Freq: Every day | ORAL | 3 refills | Status: AC

## 2022-07-19 MED ORDER — CETIRIZINE HCL 10 MG PO TABS: 10.0000 mg | ORAL_TABLET | Freq: Every day | ORAL | 3 refills | Status: AC | PRN

## 2022-07-19 MED ORDER — BUDESONIDE 32 MCG/ACT NA SUSP: 2.0000 | Freq: Every day | NASAL | 3 refills | Status: AC

## 2022-07-19 MED ORDER — IPRATROPIUM BROMIDE 0.06 % NA SOLN: 2.0000 | Freq: Four times a day (QID) | NASAL | 3 refills | Status: AC

## 2022-07-19 MED ORDER — SODIUM BICARBONATE 650 MG PO TABS: 1300.0000 mg | ORAL_TABLET | Freq: Two times a day (BID) | ORAL | 3 refills | Status: AC
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
## 2022-07-19 NOTE — Patient Instructions (Addendum)
  1. Continue to Treat and prevent inflammation:   A. Montelukast 10mg  1 tablet 1 time per day  B. Dupilumab injections every 2 weeks   C. Breztri -2 inhalations 1-2 times per day  2. Continue to treat and prevent reflux:    A.  Generic Omeprazole 40 mg + bicarbonate 2 times a day  B.  Minimize triggers: caffeine / chocolate  3.  If needed:   A. ipratropium 0.06% 2 sprays each nostril every 6 hours   B. Rhinocort / budesonide 1-2 sprays each nostril 1-7 times per week  C. nasal saline wash  D. AIRSUPRA (COUPON) or albuterol nebulization  E. over-the-counter antihistamine / Mucinex DM   F.  EpiPen / Auvi-Q 0.3  G. Topical desonide  4. Return to clinic 12 months or earlier if problem

## 2022-07-20 ENCOUNTER — Encounter: Payer: Self-pay | Admitting: Allergy and Immunology
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
## 2022-07-29 DIAGNOSIS — M542 Cervicalgia: Secondary | ICD-10-CM | POA: Diagnosis not present

## 2022-07-29 DIAGNOSIS — M546 Pain in thoracic spine: Secondary | ICD-10-CM | POA: Diagnosis not present

## 2022-08-01 DIAGNOSIS — Z6831 Body mass index (BMI) 31.0-31.9, adult: Secondary | ICD-10-CM | POA: Diagnosis not present

## 2022-08-01 DIAGNOSIS — Z01419 Encounter for gynecological examination (general) (routine) without abnormal findings: Secondary | ICD-10-CM | POA: Diagnosis not present

## 2022-08-02 DIAGNOSIS — M542 Cervicalgia: Secondary | ICD-10-CM | POA: Diagnosis not present

## 2022-08-02 DIAGNOSIS — M546 Pain in thoracic spine: Secondary | ICD-10-CM | POA: Diagnosis not present

## 2022-08-05 DIAGNOSIS — M542 Cervicalgia: Secondary | ICD-10-CM | POA: Diagnosis not present

## 2022-08-05 DIAGNOSIS — M546 Pain in thoracic spine: Secondary | ICD-10-CM | POA: Diagnosis not present

## 2022-08-10 ENCOUNTER — Ambulatory Visit: Payer: BC Managed Care – PPO | Attending: Rheumatology | Admitting: Rheumatology

## 2022-08-10 ENCOUNTER — Encounter: Payer: Self-pay | Admitting: Rheumatology

## 2022-08-10 VITALS — BP 105/72 | HR 68 | Resp 12 | Ht 64.0 in | Wt 185.8 lb

## 2022-08-10 DIAGNOSIS — M059 Rheumatoid arthritis with rheumatoid factor, unspecified: Secondary | ICD-10-CM

## 2022-08-10 DIAGNOSIS — Z8669 Personal history of other diseases of the nervous system and sense organs: Secondary | ICD-10-CM

## 2022-08-10 DIAGNOSIS — M3500 Sicca syndrome, unspecified: Secondary | ICD-10-CM | POA: Diagnosis not present

## 2022-08-10 DIAGNOSIS — M5136 Other intervertebral disc degeneration, lumbar region: Secondary | ICD-10-CM

## 2022-08-10 DIAGNOSIS — G8929 Other chronic pain: Secondary | ICD-10-CM

## 2022-08-10 DIAGNOSIS — D802 Selective deficiency of immunoglobulin A [IgA]: Secondary | ICD-10-CM

## 2022-08-10 DIAGNOSIS — Z79899 Other long term (current) drug therapy: Secondary | ICD-10-CM | POA: Diagnosis not present

## 2022-08-10 DIAGNOSIS — M25511 Pain in right shoulder: Secondary | ICD-10-CM

## 2022-08-10 DIAGNOSIS — J4541 Moderate persistent asthma with (acute) exacerbation: Secondary | ICD-10-CM

## 2022-08-10 DIAGNOSIS — M19041 Primary osteoarthritis, right hand: Secondary | ICD-10-CM

## 2022-08-10 DIAGNOSIS — M5030 Other cervical disc degeneration, unspecified cervical region: Secondary | ICD-10-CM

## 2022-08-10 DIAGNOSIS — M7062 Trochanteric bursitis, left hip: Secondary | ICD-10-CM

## 2022-08-10 DIAGNOSIS — M19042 Primary osteoarthritis, left hand: Secondary | ICD-10-CM

## 2022-08-10 DIAGNOSIS — K219 Gastro-esophageal reflux disease without esophagitis: Secondary | ICD-10-CM

## 2022-08-10 DIAGNOSIS — R5383 Other fatigue: Secondary | ICD-10-CM

## 2022-08-10 DIAGNOSIS — Z8639 Personal history of other endocrine, nutritional and metabolic disease: Secondary | ICD-10-CM

## 2022-08-10 DIAGNOSIS — Z8719 Personal history of other diseases of the digestive system: Secondary | ICD-10-CM

## 2022-08-10 DIAGNOSIS — M797 Fibromyalgia: Secondary | ICD-10-CM

## 2022-08-10 DIAGNOSIS — I471 Supraventricular tachycardia, unspecified: Secondary | ICD-10-CM

## 2022-08-10 DIAGNOSIS — M7061 Trochanteric bursitis, right hip: Secondary | ICD-10-CM

## 2022-08-10 LAB — CBC WITH DIFFERENTIAL/PLATELET
HCT: 41.8 % (ref 35.0–45.0)
MCH: 28.9 pg (ref 27.0–33.0)
MCV: 87.4 fL (ref 80.0–100.0)
Platelets: 230 10*3/uL (ref 140–400)
RBC: 4.78 10*6/uL (ref 3.80–5.10)

## 2022-08-10 NOTE — Patient Instructions (Addendum)
Vaccines You are taking a medication(s) that can suppress your immune system.  The following immunizations are recommended: Flu annually Covid-19  Td/Tdap (tetanus, diphtheria, pertussis) every 10 years Pneumonia (Prevnar 15 then Pneumovax 23 at least 1 year apart.  Alternatively, can take Prevnar 20 without needing additional dose) Shingrix: 2 doses from 4 weeks to 6 months apart  Please check with your PCP to make sure you are up to date.  Please get annual eye examination to monitor for eye toxicity from Plaquenil use.  Standing Labs We placed an order today for your standing lab work.   Please have your standing labs drawn in November and every 6 months  Please have your labs drawn 2 weeks prior to your appointment so that the provider can discuss your lab results at your appointment, if possible.  Please note that you may see your imaging and lab results in MyChart before we have reviewed them. We will contact you once all results are reviewed. Please allow our office up to 72 hours to thoroughly review all of the results before contacting the office for clarification of your results.  WALK-IN LAB HOURS  Monday through Thursday from 8:00 am -12:30 pm and 1:00 pm-5:00 pm and Friday from 8:00 am-12:00 pm.  Patients with office visits requiring labs will be seen before walk-in labs.  You may encounter longer than normal wait times. Please allow additional time. Wait times may be shorter on  Monday and Thursday afternoons.  We do not book appointments for walk-in labs. We appreciate your patience and understanding with our staff.   Labs are drawn by [facility]. Please bring your co-pay at the time of your lab draw.  You may receive a bill from [facility] for your lab work.  Please note if you are on Hydroxychloroquine and and an order has been placed for a Hydroxychloroquine level,  you will need to have it drawn 4 hours or more after your last dose.  If you wish to have your labs drawn at  another location, please call the office 24 hours in advance so we can fax the orders.  The office is located at [address], [address], [place], [place] [address]   If you have any questions regarding directions or hours of operation,  please call [phone].   As a reminder, please drink plenty of water prior to coming for your lab work. Thanks!

## 2022-08-11 LAB — CBC WITH DIFFERENTIAL/PLATELET
Absolute Monocytes: 481 cells/uL (ref 200–950)
Basophils Absolute: 12 cells/uL (ref 0–200)
Basophils Relative: 0.2 %
Eosinophils Absolute: 70 cells/uL (ref 15–500)
Eosinophils Relative: 1.2 %
Hemoglobin: 13.8 g/dL (ref 11.7–15.5)
Lymphs Abs: 2076 cells/uL (ref 850–3900)
MCHC: 33 g/dL (ref 32.0–36.0)
MPV: 10.8 fL (ref 7.5–12.5)
Monocytes Relative: 8.3 %
Neutro Abs: 3161 cells/uL (ref 1500–7800)
Neutrophils Relative %: 54.5 %
RDW: 11.9 % (ref 11.0–15.0)
Total Lymphocyte: 35.8 %
WBC: 5.8 10*3/uL (ref 3.8–10.8)

## 2022-08-11 LAB — COMPLETE METABOLIC PANEL WITH GFR
AG Ratio: 1.5 (calc) (ref 1.0–2.5)
ALT: 11 U/L (ref 6–29)
AST: 19 U/L (ref 10–35)
Albumin: 4.4 g/dL (ref 3.6–5.1)
Alkaline phosphatase (APISO): 53 U/L (ref 37–153)
BUN: 12 mg/dL (ref 7–25)
CO2: 26 mmol/L (ref 20–32)
Calcium: 9.2 mg/dL (ref 8.6–10.4)
Chloride: 104 mmol/L (ref 98–110)
Creat: 0.73 mg/dL (ref 0.50–1.03)
Globulin: 3 g/dL (calc) (ref 1.9–3.7)
Glucose, Bld: 83 mg/dL (ref 65–99)
Potassium: 4.6 mmol/L (ref 3.5–5.3)
Sodium: 140 mmol/L (ref 135–146)
Total Bilirubin: 0.4 mg/dL (ref 0.2–1.2)
Total Protein: 7.4 g/dL (ref 6.1–8.1)
eGFR: 96 mL/min/{1.73_m2} (ref >=60)

## 2022-08-11 NOTE — Progress Notes (Signed)
CBC and CMP are normal.

## 2022-08-17 DIAGNOSIS — M542 Cervicalgia: Secondary | ICD-10-CM | POA: Diagnosis not present

## 2022-08-17 DIAGNOSIS — M546 Pain in thoracic spine: Secondary | ICD-10-CM | POA: Diagnosis not present

## 2022-08-19 DIAGNOSIS — M546 Pain in thoracic spine: Secondary | ICD-10-CM | POA: Diagnosis not present

## 2022-08-19 DIAGNOSIS — M542 Cervicalgia: Secondary | ICD-10-CM | POA: Diagnosis not present

## 2022-08-22 DIAGNOSIS — M542 Cervicalgia: Secondary | ICD-10-CM | POA: Diagnosis not present

## 2022-08-22 DIAGNOSIS — M546 Pain in thoracic spine: Secondary | ICD-10-CM | POA: Diagnosis not present

## 2022-09-01 DIAGNOSIS — M546 Pain in thoracic spine: Secondary | ICD-10-CM | POA: Diagnosis not present

## 2022-09-01 DIAGNOSIS — M542 Cervicalgia: Secondary | ICD-10-CM | POA: Diagnosis not present

## 2022-09-02 DIAGNOSIS — H9311 Tinnitus, right ear: Secondary | ICD-10-CM | POA: Diagnosis not present

## 2022-09-07 DIAGNOSIS — M546 Pain in thoracic spine: Secondary | ICD-10-CM | POA: Diagnosis not present

## 2022-09-07 DIAGNOSIS — M542 Cervicalgia: Secondary | ICD-10-CM | POA: Diagnosis not present

## 2022-09-09 DIAGNOSIS — M542 Cervicalgia: Secondary | ICD-10-CM | POA: Diagnosis not present

## 2022-09-09 DIAGNOSIS — M546 Pain in thoracic spine: Secondary | ICD-10-CM | POA: Diagnosis not present

## 2022-09-14 DIAGNOSIS — M542 Cervicalgia: Secondary | ICD-10-CM | POA: Diagnosis not present

## 2022-09-14 DIAGNOSIS — M546 Pain in thoracic spine: Secondary | ICD-10-CM | POA: Diagnosis not present

## 2022-09-26 DIAGNOSIS — M546 Pain in thoracic spine: Secondary | ICD-10-CM | POA: Diagnosis not present

## 2022-09-26 DIAGNOSIS — M542 Cervicalgia: Secondary | ICD-10-CM | POA: Diagnosis not present

## 2022-09-28 DIAGNOSIS — M542 Cervicalgia: Secondary | ICD-10-CM | POA: Diagnosis not present

## 2022-09-28 DIAGNOSIS — M546 Pain in thoracic spine: Secondary | ICD-10-CM | POA: Diagnosis not present

## 2022-10-03 DIAGNOSIS — M546 Pain in thoracic spine: Secondary | ICD-10-CM | POA: Diagnosis not present

## 2022-10-03 DIAGNOSIS — M542 Cervicalgia: Secondary | ICD-10-CM | POA: Diagnosis not present

## 2022-10-07 DIAGNOSIS — M546 Pain in thoracic spine: Secondary | ICD-10-CM | POA: Diagnosis not present

## 2022-10-07 DIAGNOSIS — M542 Cervicalgia: Secondary | ICD-10-CM | POA: Diagnosis not present

## 2022-10-18 ENCOUNTER — Encounter: Payer: Self-pay | Admitting: Rheumatology

## 2022-10-27 ENCOUNTER — Ambulatory Visit
Admission: RE | Admit: 2022-10-27 | Discharge: 2022-10-27 | Disposition: A | Discharge status: Home / Self Care | Payer: BC Managed Care – PPO | Source: Ambulatory Visit | Attending: Family Medicine | Admitting: Family Medicine

## 2022-10-27 ENCOUNTER — Other Ambulatory Visit: Payer: Self-pay | Admitting: Family Medicine

## 2022-10-27 DIAGNOSIS — M19071 Primary osteoarthritis, right ankle and foot: Secondary | ICD-10-CM | POA: Diagnosis not present

## 2022-10-27 DIAGNOSIS — Z1231 Encounter for screening mammogram for malignant neoplasm of breast: Secondary | ICD-10-CM | POA: Diagnosis not present

## 2022-10-27 DIAGNOSIS — M2011 Hallux valgus (acquired), right foot: Secondary | ICD-10-CM | POA: Diagnosis not present

## 2022-10-27 DIAGNOSIS — M79671 Pain in right foot: Secondary | ICD-10-CM | POA: Diagnosis not present

## 2022-10-27 DIAGNOSIS — M79674 Pain in right toe(s): Secondary | ICD-10-CM

## 2022-11-03 ENCOUNTER — Encounter: Payer: Self-pay | Admitting: Family Medicine

## 2022-11-08 DIAGNOSIS — F411 Generalized anxiety disorder: Secondary | ICD-10-CM | POA: Diagnosis not present

## 2022-12-23 ENCOUNTER — Encounter: Payer: Self-pay | Admitting: Rheumatology

## 2022-12-23 ENCOUNTER — Encounter: Payer: Self-pay | Admitting: Allergy and Immunology

## 2022-12-26 DIAGNOSIS — G43019 Migraine without aura, intractable, without status migrainosus: Secondary | ICD-10-CM | POA: Diagnosis not present

## 2022-12-27 ENCOUNTER — Other Ambulatory Visit: Payer: Self-pay | Admitting: *Deleted

## 2022-12-27 DIAGNOSIS — Z79899 Other long term (current) drug therapy: Secondary | ICD-10-CM

## 2022-12-28 LAB — CBC WITH DIFFERENTIAL/PLATELET
Absolute Monocytes: 490 {cells}/uL (ref 200–950)
Basophils Absolute: 11 {cells}/uL (ref 0–200)
Basophils Relative: 0.2 %
Eosinophils Absolute: 91 {cells}/uL (ref 15–500)
Eosinophils Relative: 1.6 %
HCT: 41.7 % (ref 35.0–45.0)
Hemoglobin: 13.9 g/dL (ref 11.7–15.5)
Lymphs Abs: 2120 {cells}/uL (ref 850–3900)
MCH: 29 pg (ref 27.0–33.0)
MCHC: 33.3 g/dL (ref 32.0–36.0)
MCV: 86.9 fL (ref 80.0–100.0)
MPV: 10.9 fL (ref 7.5–12.5)
Monocytes Relative: 8.6 %
Neutro Abs: 2987 {cells}/uL (ref 1500–7800)
Neutrophils Relative %: 52.4 %
Platelets: 212 10*3/uL (ref 140–400)
RBC: 4.8 10*6/uL (ref 3.80–5.10)
RDW: 12 % (ref 11.0–15.0)
Total Lymphocyte: 37.2 %
WBC: 5.7 10*3/uL (ref 3.8–10.8)

## 2022-12-28 NOTE — Progress Notes (Signed)
CBC and CMP normal

## 2023-02-01 DIAGNOSIS — F411 Generalized anxiety disorder: Secondary | ICD-10-CM | POA: Diagnosis not present

## 2023-02-06 NOTE — Progress Notes (Deleted)
Virtual Visit via Video Note  I connected with Peggy Wallace on 02/06/23 at  3:40 PM EST by a video enabled telemedicine application and verified that I am speaking with the correct person using two identifiers.  Location: Patient: *** Provider: ***   I discussed the limitations of evaluation and management by telemedicine and the availability of in person appointments. The patient expressed understanding and agreed to proceed.  History of Present Illness:    Observations/Objective:   Assessment and Plan: Visit Diagnoses: Seropositive rheumatoid arthritis (HCC) - RF negative, anti-CCP positive, history of arthralgias: Ultrasound of both hands on 01/02/2020 revealed synovitis and tenosynovitis: She had no synovitis on the examination.  She has been experiencing increased pain and discomfort in her bilateral hands.  Bilateral PIP and DIP thickening was noted.  I believe the discomfort is coming from underlying osteoarthritis.  No change in treatment was advised.  Patient states that she will be traveling a lot to [place].  She will try to come to the [place] once or twice a year and will see [place] when she is here.   High risk medication use - Plaquenil 200 mg 2 tablets on Tuesday, Thursday, and Saturday and 1 tablet every other day. PLQ eye exam: 03/14/2022 -her labs in November were normal.  Will get labs today.  Plan: CBC with Differential/Platelet, COMPLETE METABOLIC PANEL WITH GFR.  Patient was advised to get labs every 6 months at least.  Annual eye examination to screen for ocular toxicity was advised.  Information on immunization was placed in the AVS.   Sicca complex (HCC) - History of dry eyes, positive ANA 1: 40 cytoplasmic, positive SSB antibody.  History of Raynaud's, arthralgia and photosensitivity.  She has been using eyedrops.  Over-the-counter products were also discussed.   Chronic right shoulder pain - Followed by Dr. Prince Rome.  She gets PRP through Dr. Dwain Sarna.    Primary osteoarthritis of both hands-bilateral PIP and DIP thickening was noted.  Joint protection muscle strengthening was discussed.   Trochanteric bursitis of both hips-she continues to have trochanteric bursa discomfort.  IT band stretches were advised.   DDD (degenerative disc disease), cervical-recent diagnosis based on MRI per patient.  I do not have report to review.   DDD (degenerative disc disease), lumbar-recent diagnosis based on MRI per patient.  I do not have the report to review.  Patient will forward report.   Fibromyalgia - Diagnosed by Dr. Kellie Simmering in the past.  She continues to have generalized pain and discomfort.   Other fatigue-she has been has increased fatigue.  She states she has been under a lot of stress.   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   History of IBS   Hx of migraines   Moderate persistent asthma with acute exacerbation - She is followed by Dr. Celine Mans.  Follow Up Instructions:    I discussed the assessment and treatment plan with the patient. The patient was provided an opportunity to ask questions and all were answered. The patient agreed with the plan and demonstrated an understanding of the instructions.   The patient was advised to call back or seek an in-person evaluation if the symptoms worsen or if the condition fails to improve as anticipated.  I provided *** minutes of non-face-to-face time during this encounter.  Pollyann Savoy, MD

## 2023-02-14 ENCOUNTER — Telehealth: Payer: BC Managed Care – PPO | Admitting: Rheumatology

## 2023-02-14 DIAGNOSIS — R5383 Other fatigue: Secondary | ICD-10-CM

## 2023-02-14 DIAGNOSIS — Z8719 Personal history of other diseases of the digestive system: Secondary | ICD-10-CM

## 2023-02-14 DIAGNOSIS — M51362 Other intervertebral disc degeneration, lumbar region with discogenic back pain and lower extremity pain: Secondary | ICD-10-CM

## 2023-02-14 DIAGNOSIS — Z79899 Other long term (current) drug therapy: Secondary | ICD-10-CM

## 2023-02-14 DIAGNOSIS — M5030 Other cervical disc degeneration, unspecified cervical region: Secondary | ICD-10-CM

## 2023-02-14 DIAGNOSIS — I471 Supraventricular tachycardia, unspecified: Secondary | ICD-10-CM

## 2023-02-14 DIAGNOSIS — M19041 Primary osteoarthritis, right hand: Secondary | ICD-10-CM

## 2023-02-14 DIAGNOSIS — K219 Gastro-esophageal reflux disease without esophagitis: Secondary | ICD-10-CM

## 2023-02-14 DIAGNOSIS — Z8639 Personal history of other endocrine, nutritional and metabolic disease: Secondary | ICD-10-CM

## 2023-02-14 DIAGNOSIS — D802 Selective deficiency of immunoglobulin A [IgA]: Secondary | ICD-10-CM

## 2023-02-14 DIAGNOSIS — M7061 Trochanteric bursitis, right hip: Secondary | ICD-10-CM

## 2023-02-14 DIAGNOSIS — Z8669 Personal history of other diseases of the nervous system and sense organs: Secondary | ICD-10-CM

## 2023-02-14 DIAGNOSIS — G8929 Other chronic pain: Secondary | ICD-10-CM

## 2023-02-14 DIAGNOSIS — M3500 Sicca syndrome, unspecified: Secondary | ICD-10-CM

## 2023-02-14 DIAGNOSIS — M059 Rheumatoid arthritis with rheumatoid factor, unspecified: Secondary | ICD-10-CM

## 2023-02-14 DIAGNOSIS — J4541 Moderate persistent asthma with (acute) exacerbation: Secondary | ICD-10-CM

## 2023-02-14 DIAGNOSIS — M797 Fibromyalgia: Secondary | ICD-10-CM

## 2023-05-02 LAB — LAB REPORT - SCANNED: EGFR: 83

## 2023-05-03 ENCOUNTER — Encounter: Payer: Self-pay | Admitting: Allergy and Immunology

## 2023-05-03 ENCOUNTER — Encounter: Payer: Self-pay | Admitting: Rheumatology

## 2023-05-09 ENCOUNTER — Encounter: Payer: Self-pay | Admitting: Rheumatology

## 2023-05-09 NOTE — Telephone Encounter (Signed)
Peggy Wallace came into the office and stated she needed the letter and has provided the doctors' name.  Dr. Ronald Pippins  Patient is requesting that the letter be emailed directly to the doctors office at:  [email or web address]   The name of the clinic is : [facility]  Peggy Wallace states she is going back to the [place] this weekend and if this letter cannot be emailed, to please call her ASAP before Friday so she can come pick it up.

## 2023-05-10 ENCOUNTER — Telehealth: Payer: Self-pay | Admitting: *Deleted

## 2023-05-10 NOTE — Telephone Encounter (Signed)
Labs received from: Dr. Judie Petit. [place]  Drawn on: 05/01/2023  Reviewed by: Sherron Ales, PA-C  Labs drawn: CBC/CMP  Results: WNL

## 2023-05-19 ENCOUNTER — Encounter: Payer: Self-pay | Admitting: Allergy and Immunology

## 2023-05-19 NOTE — Telephone Encounter (Signed)
 Hey I have typed up a letter for her since 2021. Peterson Brandt is currently working on the letter, you should check with her.

## 2023-06-27 ENCOUNTER — Ambulatory Visit: Payer: Self-pay | Admitting: Allergy and Immunology

## 2024-04-05 ENCOUNTER — Encounter: Payer: Self-pay | Admitting: Cardiovascular Disease
